# Patient Record
Sex: Male | Born: 1965 | Race: White | Hispanic: No | Marital: Married | Smoking: Former smoker
Health system: Southern US, Community
[De-identification: ages and names within clinical notes are randomized; demographics above are authoritative.]

## PROBLEM LIST (undated history)

## (undated) DIAGNOSIS — H269 Unspecified cataract: Secondary | ICD-10-CM

## (undated) DIAGNOSIS — R519 Headache, unspecified: Secondary | ICD-10-CM

## (undated) DIAGNOSIS — R51 Headache: Secondary | ICD-10-CM

## (undated) DIAGNOSIS — M1990 Unspecified osteoarthritis, unspecified site: Secondary | ICD-10-CM

## (undated) DIAGNOSIS — T7840XA Allergy, unspecified, initial encounter: Secondary | ICD-10-CM

## (undated) HISTORY — DX: Allergy, unspecified, initial encounter: T78.40XA

## (undated) HISTORY — PX: ELBOW FRACTURE SURGERY: SHX616

## (undated) HISTORY — DX: Unspecified cataract: H26.9

## (undated) HISTORY — PX: HERNIA REPAIR: SHX51

---

## 2012-09-22 HISTORY — PX: COLONOSCOPY: SHX174

## 2012-11-30 ENCOUNTER — Ambulatory Visit (INDEPENDENT_AMBULATORY_CARE_PROVIDER_SITE_OTHER): Payer: 59 | Admitting: Emergency Medicine

## 2012-11-30 VITALS — BP 110/62 | HR 69 | Temp 98.0°F | Resp 16 | Ht 70.0 in | Wt 154.2 lb

## 2012-11-30 DIAGNOSIS — B86 Scabies: Secondary | ICD-10-CM

## 2012-11-30 MED ORDER — PERMETHRIN 5 % EX CREA: TOPICAL_CREAM | Freq: Once | CUTANEOUS | Status: DC

## 2012-11-30 NOTE — Progress Notes (Signed)
[facility] [address] [place] [address] [phone]  Date:  11/30/2012   Name:  Dennis Morris   DOB:  1966/07/22   MRN:  [id number]  PCP:  No primary provider on file.    Chief Complaint: Rash   History of Present Illness:  Dennis Morris is a 47 y.o. very pleasant male patient who presents with the following:  Developed a rash three weeks ago.  Told that it was allergic reaction and treated as such.  He found that one of his coworkers was treated for scabies.  He works in Science writer.  Denies other complaint or health concern today.   There is no problem list on file for this patient.   Past Medical History  Diagnosis Date  . Allergy     No past surgical history on file.  History  Substance Use Topics  . Smoking status: Former Games developer  . Smokeless tobacco: Not on file  . Alcohol Use: Not on file    Family History  Problem Relation Age of Onset  . Arthritis Mother   . Kidney disease Father   . Arthritis Maternal Grandmother   . Heart disease Maternal Grandfather   . Cancer Paternal Grandmother     No Known Allergies  Medication list has been reviewed and updated.  No current outpatient prescriptions on file prior to visit.   No current facility-administered medications on file prior to visit.    Review of Systems:  As per HPI, otherwise negative.    Physical Examination: Filed Vitals:   11/30/12 1402  BP: 110/62  Pulse: 69  Temp: 98 F (36.7 C)  Resp: 16   Filed Vitals:   11/30/12 1402  Height: 5\' 10"  (1.778 m)  Weight: 154 lb 3.2 oz (69.945 kg)   Body mass index is 22.13 kg/(m^2). Ideal Body Weight: Weight in (lb) to have BMI = 25: 173.9   GEN: WDWN, NAD, Non-toxic, Alert & Oriented x 3 HEENT: Atraumatic, Normocephalic.  Ears and Nose: No external deformity. EXTR: No clubbing/cyanosis/edema NEURO: Normal gait.  PSYCH: Normally interactive. Conversant. Not depressed or anxious appearing.  Calm demeanor.   SKIN: generalized eruption of rash characteristic of scabies  Assessment and Plan: Scabies permethrin Follow up as needed  Carmelina Dane, MD

## 2012-11-30 NOTE — Patient Instructions (Signed)
Scabies Scabies are small bugs (mites) that burrow under the skin and cause red bumps and severe itching. These bugs can only be seen with a microscope. Scabies are highly contagious. They can spread easily from person to person by direct contact. They are also spread through sharing clothing or linens that have the scabies mites living in them. It is not unusual for an entire family to become infected through shared towels, clothing, or bedding.  HOME CARE INSTRUCTIONS   Your caregiver may prescribe a cream or lotion to kill the mites. If cream is prescribed, massage the cream into the entire body from the neck to the bottom of both feet. Also massage the cream into the scalp and face if your child is less than 1 year old. Avoid the eyes and mouth. Do not wash your hands after application.  Leave the cream on for 8 to 12 hours. Your child should bathe or shower after the 8 to 12 hour application period. Sometimes it is helpful to apply the cream to your child right before bedtime.  One treatment is usually effective and will eliminate approximately 95% of infestations. For severe cases, your caregiver may decide to repeat the treatment in 1 week. Everyone in your household should be treated with one application of the cream.  New rashes or burrows should not appear within 24 to 48 hours after successful treatment. However, the itching and rash may last for 2 to 4 weeks after successful treatment. Your caregiver may prescribe a medicine to help with the itching or to help the rash go away more quickly.  Scabies can live on clothing or linens for up to 3 days. All of your child's recently used clothing, towels, stuffed toys, and bed linens should be washed in hot water and then dried in a dryer for at least 20 minutes on high heat. Items that cannot be washed should be enclosed in a plastic bag for at least 3 days.  To help relieve itching, bathe your child in a cool bath or apply cool washcloths to the  affected areas.  Your child may return to school after treatment with the prescribed cream. SEEK MEDICAL CARE IF:   The itching persists longer than 4 weeks after treatment.  The rash spreads or becomes infected. Signs of infection include red blisters or yellow-tan crust. Document Released: 09/08/2005 Document Revised: 12/01/2011 Document Reviewed: 01/17/2009 ExitCare Patient Information 2013 [employer].  

## 2012-12-15 NOTE — Progress Notes (Signed)
Reviewed and agree.

## 2016-06-02 ENCOUNTER — Ambulatory Visit (INDEPENDENT_AMBULATORY_CARE_PROVIDER_SITE_OTHER): Payer: 59 | Admitting: Physician Assistant

## 2016-06-02 VITALS — BP 118/80 | HR 73 | Temp 97.9°F | Resp 17 | Ht 70.0 in | Wt 155.0 lb

## 2016-06-02 DIAGNOSIS — R1900 Intra-abdominal and pelvic swelling, mass and lump, unspecified site: Secondary | ICD-10-CM

## 2016-06-02 DIAGNOSIS — R1909 Other intra-abdominal and pelvic swelling, mass and lump: Secondary | ICD-10-CM

## 2016-06-02 DIAGNOSIS — R1031 Right lower quadrant pain: Secondary | ICD-10-CM

## 2016-06-02 LAB — POC MICROSCOPIC URINALYSIS (UMFC): MUCUS RE: ABSENT

## 2016-06-02 LAB — POCT URINALYSIS DIP (MANUAL ENTRY)
BILIRUBIN UA: NEGATIVE
Bilirubin, UA: NEGATIVE
Glucose, UA: NEGATIVE
Leukocytes, UA: NEGATIVE
Nitrite, UA: NEGATIVE
PH UA: 7
PROTEIN UA: NEGATIVE
SPEC GRAV UA: 1.015
UROBILINOGEN UA: 0.2

## 2016-06-02 NOTE — Progress Notes (Signed)
[facility] [address] [place] [address] [phone]  By signing my name below, I, Moises Blood, attest that this documentation has been prepared under the direction and in the presence of Stephanie English, PA-C. Electronically Signed: Moises Blood, Scribe. 06/02/2016 , 4:43 PM .  Patient was seen in Room 13 .  Date:  06/02/2016   Name:  Dennis Morris   DOB:  March 11, 1966   MRN:  [id number]  PCP:  No PCP Per Patient    History of Present Illness: Chief Complaint  Patient presents with   Hernia    Noticed last month after working out    Dennis Morris is a 50 y.o. male patient who presents to [facility] complaining of an abdominal bulge noticed less than a month ago after working out. Patient states he was doing the "[employer] workout", and was doing a deadlift. He felt a bulge in his RLQ abdomen. He mentions occasional soreness when he lifts. He denies history of any hernias. He denies any fever. He rates his pain at a 1~2 out of 10 today. He denies pain radiating into his groin.   There are no active problems to display for this patient.   Past Medical History:  Diagnosis Date   Allergy    Cataract     No past surgical history on file.  Social History  Substance Use Topics   Smoking status: Former Smoker   Smokeless tobacco: Not on file   Alcohol use Not on file    Family History  Problem Relation Age of Onset   Arthritis Mother    Kidney disease Father    Arthritis Maternal Grandmother    Heart disease Maternal Grandfather    Cancer Paternal Grandmother     No Known Allergies  Medication list has been reviewed and updated.  Current Outpatient Prescriptions on File Prior to Visit  Medication Sig Dispense Refill   aspirin-acetaminophen-caffeine (EXCEDRIN MIGRAINE) 250-250-65 MG per tablet Take 1 tablet by mouth every 6 (six) hours as needed for pain.     No current facility-administered medications on file prior to visit.      Review of Systems  Constitutional: Negative for chills, fever and malaise/fatigue.  Gastrointestinal: Negative for abdominal pain, constipation, diarrhea and nausea.  Musculoskeletal: Negative for myalgias.     Physical Examination: BP 118/80 (BP Location: Right Arm, Patient Position: Sitting, Cuff Size: Normal)    Pulse 73    Temp 97.9 F (36.6 C) (Oral)    Resp 17    Ht 5\' 10"  (1.778 m)    Wt 155 lb (70.3 kg)    SpO2 97%    BMI 22.24 kg/m  Ideal Body Weight: @FLOWAMB [id number]  Physical Exam  Constitutional: He is oriented to person, place, and time. He appears well-developed and well-nourished. No distress.  HENT:  Head: Normocephalic and atraumatic.  Eyes: Conjunctivae and EOM are normal. Pupils are equal, round, and reactive to light.  Cardiovascular: Normal rate.   Pulmonary/Chest: Effort normal. No respiratory distress.  Abdominal: Soft. Bowel sounds are normal. He exhibits no distension. There is no tenderness. A hernia is present. Hernia confirmed negative in the right inguinal area and confirmed negative in the left inguinal area.  Neurological: He is alert and oriented to person, place, and time.  Skin: Skin is warm and dry. He is not diaphoretic.  Psychiatric: He has a normal mood and affect. His behavior is normal.   Results for orders placed or performed in visit on  06/02/16  POCT urinalysis dipstick  Result Value Ref Range   Color, UA yellow yellow   Clarity, UA clear clear   Glucose, UA negative negative   Bilirubin, UA negative negative   Ketones, POC UA negative negative   Spec Grav, UA 1.015    Blood, UA trace-intact (A) negative   pH, UA 7.0    Protein Ur, POC negative negative   Urobilinogen, UA 0.2    Nitrite, UA Negative Negative   Leukocytes, UA Negative Negative     Assessment and Plan: Randie Ewin is a 50 y.o. male who is here today for groin swelling. --will order an [place] sound for pelvis at this time for possible hernia.    Groin  swelling - Plan: POCT urinalysis dipstick, US Pelvis Complete  Groin pain, right - Plan: US Pelvis Complete, POCT Microscopic Urinalysis ([facility])  Ivar Drape, PA-C [facility] 9/15/20179:27 AM

## 2016-06-02 NOTE — Patient Instructions (Addendum)
I am scheduling an ultrasound of your pelvis.  Please await contact.  This is likely a hernia, but we will see if this could be something else.  Hernia, Adult A hernia is the bulging of an organ or tissue through a weak spot in the muscles of the abdomen (abdominal wall). Hernias develop most often near the navel or groin. There are many kinds of hernias. Common kinds include:  Femoral hernia. This kind of hernia develops under the groin in the upper thigh area.  Inguinal hernia. This kind of hernia develops in the groin or scrotum.  Umbilical hernia. This kind of hernia develops near the navel.  Hiatal hernia. This kind of hernia causes part of the stomach to be pushed up into the chest.  Incisional hernia. This kind of hernia bulges through a scar from an abdominal surgery. CAUSES This condition may be caused by:  Heavy lifting.  Coughing over a long period of time.  Straining to have a bowel movement.  An incision made during an abdominal surgery.  A birth defect (congenital defect).  Excess weight or obesity.  Smoking.  Poor nutrition.  Cystic fibrosis.  Excess fluid in the abdomen.  Undescended testicles. SYMPTOMS Symptoms of a hernia include:  A lump on the abdomen. This is the first sign of a hernia. The lump may become more obvious with standing, straining, or coughing. It may get bigger over time if it is not treated or if the condition causing it is not treated.  Pain. A hernia is usually painless, but it may become painful over time if treatment is delayed. The pain is usually dull and may get worse with standing or lifting heavy objects. Sometimes a hernia gets tightly squeezed in the weak spot (strangulated) or stuck there (incarcerated) and causes additional symptoms. These symptoms may include:  Vomiting.  Nausea.  Constipation.  Irritability. DIAGNOSIS A hernia may be diagnosed with:  A physical exam. During the exam your health care provider  may ask you to cough or to make a specific movement, because a hernia is usually more visible when you move.  Imaging tests. These can include:  X-rays.  Ultrasound.  CT scan. TREATMENT A hernia that is small and painless may not need to be treated. A hernia that is large or painful may be treated with surgery. Inguinal hernias may be treated with surgery to prevent incarceration or strangulation. Strangulated hernias are always treated with surgery, because lack of blood to the trapped organ or tissue can cause it to die. Surgery to treat a hernia involves pushing the bulge back into place and repairing the weak part of the abdomen. HOME CARE INSTRUCTIONS  Avoid straining.  Do not lift anything heavier than 10 lb (4.5 kg).  Lift with your leg muscles, not your back muscles. This helps avoid strain.  When coughing, try to cough gently.  Prevent constipation. Constipation leads to straining with bowel movements, which can make a hernia worse or cause a hernia repair to break down. You can prevent constipation by:  Eating a high-fiber diet that includes plenty of fruits and vegetables.  Drinking enough fluids to keep your urine clear or pale yellow. Aim to drink 6-8 glasses of water per day.  Using a stool softener as directed by your health care provider.  Lose weight, if you are overweight.  Do not use any tobacco products, including cigarettes, chewing tobacco, or electronic cigarettes. If you need help quitting, ask your health care provider.  Keep all  follow-up visits as directed by your health care provider. This is important. Your health care provider may need to monitor your condition. SEEK MEDICAL CARE IF:  You have swelling, redness, and pain in the affected area.  Your bowel habits change. SEEK IMMEDIATE MEDICAL CARE IF:  You have a fever.  You have abdominal pain that is getting worse.  You feel nauseous or you vomit.  You cannot push the hernia back in place  by gently pressing on it while you are lying down.  The hernia:  Changes in shape or size.  Is stuck outside the abdomen.  Becomes discolored.  Feels hard or tender.   This information is not intended to replace advice given to you by your health care provider. Make sure you discuss any questions you have with your health care provider.   Document Released: 09/08/2005 Document Revised: 09/29/2014 Document Reviewed: 07/19/2014 Elsevier Interactive Patient Education 2016 [employer].    IF you received an x-ray today, you will receive an invoice from [facility]. Please contact [facility] at [phone] with questions or concerns regarding your invoice.   IF you received labwork today, you will receive an invoice from [employer]. Please contact [facility] at [phone] with questions or concerns regarding your invoice.   Our billing staff will not be able to assist you with questions regarding bills from these companies.  You will be contacted with the lab results as soon as they are available. The fastest way to get your results is to activate your My Chart account. Instructions are located on the last page of this paperwork. If you have not heard from [place] regarding the results in 2 weeks, please contact this office.

## 2016-06-09 ENCOUNTER — Telehealth: Payer: Self-pay

## 2016-06-09 DIAGNOSIS — R1031 Right lower quadrant pain: Secondary | ICD-10-CM

## 2016-06-09 DIAGNOSIS — R1909 Other intra-abdominal and pelvic swelling, mass and lump: Secondary | ICD-10-CM

## 2016-06-09 NOTE — Telephone Encounter (Addendum)
[facility] needs the pelvis ultrasound order from 06/02/16 changed.  It is currently listed as an ultrasound pelvis complete, but it needs to be changed to an ultrasound pelvis limited [id number]).  Thank you.  The patient has an appointment scheduled for Monday but the order will still need to be changed in order to keep the appointment.  CB#: [id number]

## 2016-06-13 NOTE — Telephone Encounter (Signed)
Thank you.  I signed off on this order that you (and [place]) requested.

## 2016-06-13 NOTE — Telephone Encounter (Signed)
Dennis Morris, I pended the US Pelvis Limited that [facility] requested, but I did not sign ir. Wanted to make sure this is the test you want done.

## 2016-06-16 ENCOUNTER — Ambulatory Visit
Admission: RE | Admit: 2016-06-16 | Discharge: 2016-06-16 | Disposition: A | Discharge status: Home / Self Care | Payer: 59 | Source: Ambulatory Visit | Attending: Physician Assistant | Admitting: Physician Assistant

## 2016-06-17 ENCOUNTER — Ambulatory Visit: Payer: 59

## 2016-06-18 ENCOUNTER — Telehealth: Payer: Self-pay

## 2016-06-18 NOTE — Telephone Encounter (Signed)
Dennis Morris   Patient would like to talk with you.  He is at work and cannot answer the phone.

## 2016-06-23 NOTE — Telephone Encounter (Signed)
Patient states he has already spoken to [place] . Best time to call him is after 3pm

## 2016-06-24 ENCOUNTER — Other Ambulatory Visit: Payer: Self-pay | Admitting: Physician Assistant

## 2016-06-24 DIAGNOSIS — K439 Ventral hernia without obstruction or gangrene: Secondary | ICD-10-CM

## 2016-07-07 ENCOUNTER — Ambulatory Visit: Payer: Self-pay | Admitting: Surgery

## 2016-07-07 NOTE — H&P (Signed)
Dennis Morris 07/07/2016 3:52 PM Location: [facility] Patient #: [id number] DOB: [birth year] Married / Language: English / Race: White Male  History of Present Illness Dennis Hector MD; 07/07/2016 4:16 PM) The patient is a 50 year old male who presents with an inguinal hernia. Note for "Inguinal hernia": Patient sent for surgical consultation [facility] Concern for right groin pain and probable inguinal hernia.  Pleasant active male. Works with an occupational therapy with lifting and other activity. Likes to exercise. Moderate kickboxing style exercising. Noticed some groin discomfort and swelling. Right side especially. Discuss with primary care physician had urgent center. Exam and ultrasound suspicious for retinal hernia. Patient has noticed a little bit of bulging on the left side recently. Surgical consultation requested.  He does not smoke anymore. He has never had abdominal or hernia surgery. No problems with urination or defecation. Usually moves his bowels about twice a day.   Other Problems Marjean Donna, CMA; 07/07/2016 3:53 PM) Other disease, cancer, significant illness  Past Surgical History Marjean Donna, [place]; 07/07/2016 3:53 PM) Colon Polyp Removal - Colonoscopy  Diagnostic Studies History Marjean Donna, CMA; 07/07/2016 3:53 PM) Colonoscopy 1-5 years ago  Allergies Davy Pique Bynum, CMA; 07/07/2016 3:53 PM) No Known Drug Allergies 07/07/2016  Medication History (Sonya Bynum, CMA; 07/07/2016 3:54 PM) ZyrTEC Allergy (10MG  Capsule, Oral) Active. Excedrin Migraine (250-250-65MG  Tablet, Oral as needed) Active. Medications Reconciled  Social History Marjean Donna, CMA; 07/07/2016 3:53 PM) Alcohol use Occasional alcohol use. Caffeine use Carbonated beverages. No drug use Tobacco use Former smoker.  Family History Marjean Donna, [place]; 07/07/2016 3:53 PM) Arthritis Father,  Mother. Cancer Sister. Colon Cancer Father, Sister. Colon Polyps Brother, Father, Sister. Depression Mother. Diabetes Mellitus Father. Kidney Disease Father.     Review of Systems Davy Pique Bynum CMA; 07/07/2016 3:53 PM) General Not Present- Appetite Loss, Chills, Fatigue, Fever, Night Sweats, Weight Gain and Weight Loss. Skin Not Present- Change in Wart/Mole, Dryness, Hives, Jaundice, New Lesions, Non-Healing Wounds, Rash and Ulcer. HEENT Present- Seasonal Allergies. Not Present- Earache, Hearing Loss, Hoarseness, Nose Bleed, Oral Ulcers, Ringing in the Ears, Sinus Pain, Sore Throat, Visual Disturbances, Wears glasses/contact lenses and Yellow Eyes. Respiratory Not Present- Bloody sputum, Chronic Cough, Difficulty Breathing, Snoring and Wheezing. Breast Not Present- Breast Mass, Breast Pain, Nipple Discharge and Skin Changes. Cardiovascular Not Present- Chest Pain, Difficulty Breathing Lying Down, Leg Cramps, Palpitations, Rapid Heart Rate, Shortness of Breath and Swelling of Extremities. Gastrointestinal Present- Abdominal Pain. Not Present- Bloating, Bloody Stool, Change in Bowel Habits, Chronic diarrhea, Constipation, Difficulty Swallowing, Excessive gas, Gets full quickly at meals, Hemorrhoids, Indigestion, Nausea, Rectal Pain and Vomiting. Male Genitourinary Not Present- Blood in Urine, Change in Urinary Stream, Frequency, Impotence, Nocturia, Painful Urination, Urgency and Urine Leakage. Musculoskeletal Not Present- Back Pain, Joint Pain, Joint Stiffness, Muscle Pain, Muscle Weakness and Swelling of Extremities. Neurological Not Present- Decreased Memory, Fainting, Headaches, Numbness, Seizures, Tingling, Tremor, Trouble walking and Weakness. Psychiatric Not Present- Anxiety, Bipolar, Change in Sleep Pattern, Depression, Fearful and Frequent crying. Endocrine Not Present- Cold Intolerance, Excessive Hunger, Hair Changes, Heat Intolerance, Hot flashes and New Diabetes. Hematology Not  Present- Blood Thinners, Easy Bruising, Excessive bleeding, Gland problems, HIV and Persistent Infections.  Vitals (Sonya Bynum CMA; 07/07/2016 3:53 PM) 07/07/2016 3:53 PM Weight: 154.4 lb Height: 70in Body Surface Area: 1.87 m Body Mass Index: 22.15 kg/m  Temp.: 98.3F(Temporal)  Pulse: 74 (Regular)  BP: 126/80 (Sitting, Left Arm, Standard)      Physical Exam (  Dennis Hector MD; 07/07/2016 4:13 PM)  General Mental Status-Alert. General Appearance-Not in acute distress, Not Sickly. Orientation-Oriented X3. Hydration-Well hydrated. Voice-Normal.  Integumentary Global Assessment Upon inspection and palpation of skin surfaces of the - Axillae: non-tender, no inflammation or ulceration, no drainage. and Distribution of scalp and body hair is normal. General Characteristics Temperature - normal warmth is noted.  Head and Neck Head-normocephalic, atraumatic with no lesions or palpable masses. Face Global Assessment - atraumatic, no absence of expression. Neck Global Assessment - no abnormal movements, no bruit auscultated on the right, no bruit auscultated on the left, no decreased range of motion, non-tender. Trachea-midline. Thyroid Gland Characteristics - non-tender.  Eye Eyeball - Left-Extraocular movements intact, No Nystagmus. Eyeball - Right-Extraocular movements intact, No Nystagmus. Cornea - Left-No Hazy. Cornea - Right-No Hazy. Sclera/Conjunctiva - Left-No scleral icterus, No Discharge. Sclera/Conjunctiva - Right-No scleral icterus, No Discharge. Pupil - Left-Direct reaction to light normal. Pupil - Right-Direct reaction to light normal.  ENMT Ears Pinna - Left - no drainage observed, no generalized tenderness observed. Right - no drainage observed, no generalized tenderness observed. Nose and Sinuses External Inspection of the Nose - no destructive lesion observed. Inspection of the nares - Left - quiet  respiration. Right - quiet respiration. Mouth and Throat Lips - Upper Lip - no fissures observed, no pallor noted. Lower Lip - no fissures observed, no pallor noted. Nasopharynx - no discharge present. Oral Cavity/Oropharynx - Tongue - no dryness observed. Oral Mucosa - no cyanosis observed. Hypopharynx - no evidence of airway distress observed.  Chest and Lung Exam Inspection Movements - Normal and Symmetrical. Accessory muscles - No use of accessory muscles in breathing. Palpation Palpation of the chest reveals - Non-tender. Auscultation Breath sounds - Normal and Clear.  Cardiovascular Auscultation Rhythm - Regular. Murmurs & Other Heart Sounds - Auscultation of the heart reveals - No Murmurs and No Systolic Clicks.  Abdomen Inspection Inspection of the abdomen reveals - No Visible peristalsis and No Abnormal pulsations. Umbilicus - No Bleeding, No Urine drainage. Palpation/Percussion Palpation and Percussion of the abdomen reveal - Soft, Non Tender, No Rebound tenderness, No Rigidity (guarding) and No Cutaneous hyperesthesia. Note: No inguinal hernias. Normal external genitalia. Epididymi, testes, and spermatic cords normal without any masses.  Male Genitourinary Sexual Maturity Tanner 5 - Adult hair pattern and Adult penile size and shape. Note: Right greater than left bilateral inguinal hernias. Sensitive but reducible. Focused mainly in the groin. Normal external genitalia. Epididymi, testes, and spermatic cords normal without any masses.  Peripheral Vascular Upper Extremity Inspection - Left - No Cyanotic nailbeds, Not Ischemic. Right - No Cyanotic nailbeds, Not Ischemic.  Neurologic Neurologic evaluation reveals -normal attention span and ability to concentrate, able to name objects and repeat phrases. Appropriate fund of knowledge , normal sensation and normal coordination. Mental Status Affect - not angry, not paranoid. Cranial Nerves-Normal  Bilaterally. Gait-Normal.  Neuropsychiatric Mental status exam performed with findings of-able to articulate well with normal speech/language, rate, volume and coherence, thought content normal with ability to perform basic computations and apply abstract reasoning and no evidence of hallucinations, delusions, obsessions or homicidal/suicidal ideation.  Musculoskeletal Global Assessment Spine, Ribs and Pelvis - no instability, subluxation or laxity. Right Upper Extremity - no instability, subluxation or laxity.  Lymphatic Head & Neck  General Head & Neck Lymphatics: Bilateral - Description - No Localized lymphadenopathy. Axillary  General Axillary Region: Bilateral - Description - No Localized lymphadenopathy. Femoral & Inguinal  Generalized Femoral & Inguinal Lymphatics: Left -  Description - No Localized lymphadenopathy. Right - Description - No Localized lymphadenopathy.    Assessment & Plan Dennis Hector MD; 07/07/2016 4:17 PM)  BILATERAL INGUINAL HERNIA WITHOUT OBSTRUCTION OR GANGRENE, RECURRENCE NOT SPECIFIED (K40.20) Impression: Right greater than left bilateral inguinal hernias. Sensitive.  I think he would benefit from surgical repair. He wishes to proceed as soon as possible so he can get back to work and back to his intense exercise. 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 I did caution that he probably be 2 weeks before light duty in 6 weeks before unrestricted activity. He will check with work to see if there is any a formal FMLA like paperwork etc. needed.  PREOP - ING HERNIA - ENCOUNTER FOR PREOPERATIVE EXAMINATION FOR GENERAL SURGICAL PROCEDURE (Z01.818)  Current Plans You are being scheduled for surgery - Our schedulers will call you.  You should hear from our office's scheduling department within 5 working days about the location, date, and time of surgery. We try to make accommodations for patient's  preferences in scheduling surgery, but sometimes the OR schedule or the surgeon's schedule prevents [place] from making those accommodations.  If you have not heard from our office [phone]) in 5 working days, call the office and ask for your surgeon's nurse.  If you have other questions about your diagnosis, plan, or surgery, call the office and ask for your surgeon's nurse.  Written instructions provided The anatomy & physiology of the abdominal wall and pelvic floor was discussed. The pathophysiology of hernias in the inguinal and pelvic region was discussed. Natural history risks such as progressive enlargement, pain, incarceration, and strangulation was discussed. Contributors to complications such as smoking, obesity, diabetes, prior surgery, etc were discussed.  I feel the risks of no intervention will lead to serious problems that outweigh the operative risks; therefore, I recommended surgery to reduce and repair the hernia. I explained laparoscopic techniques with possible need for an open approach. I noted usual use of mesh to patch and/or buttress hernia repair  Risks such as bleeding, infection, abscess, need for further treatment, heart attack, death, and other risks were discussed. I noted a good likelihood this will help address the problem. Goals of post-operative recovery were discussed as well. Possibility that this will not correct all symptoms was explained. I stressed the importance of low-impact activity, aggressive pain control, avoiding constipation, & not pushing through pain to minimize risk of post-operative chronic pain or injury. Possibility of reherniation was discussed. We will work to minimize complications.  An educational handout further explaining the pathology & treatment options was given as well. Questions were answered. The patient expresses understanding & wishes to proceed with surgery.  Pt Education - Pamphlet Given - Laparoscopic Hernia Repair: discussed with  patient and provided information. Pt Education - [facility] Pain Control (Maryelizabeth Eberle) Pt Education - [facility] Hernia Post-Op HCI (Hosanna Betley): discussed with patient and provided information.  Dennis Morris, M.D., F.A.C.S. Gastrointestinal and Minimally Invasive Surgery [facility] [address] [place], [place] [address] [phone] Main / Paging

## 2016-08-18 ENCOUNTER — Encounter (HOSPITAL_COMMUNITY): Payer: Self-pay

## 2016-08-18 ENCOUNTER — Encounter (INDEPENDENT_AMBULATORY_CARE_PROVIDER_SITE_OTHER): Payer: Self-pay

## 2016-08-18 ENCOUNTER — Encounter (HOSPITAL_COMMUNITY)
Admission: RE | Admit: 2016-08-18 | Discharge: 2016-08-18 | Disposition: A | Discharge status: Home / Self Care | Payer: 59 | Source: Ambulatory Visit | Attending: Surgery | Admitting: Surgery

## 2016-08-18 DIAGNOSIS — Z87891 Personal history of nicotine dependence: Secondary | ICD-10-CM | POA: Diagnosis not present

## 2016-08-18 DIAGNOSIS — Z01818 Encounter for other preprocedural examination: Secondary | ICD-10-CM | POA: Insufficient documentation

## 2016-08-18 DIAGNOSIS — K4090 Unilateral inguinal hernia, without obstruction or gangrene, not specified as recurrent: Secondary | ICD-10-CM

## 2016-08-18 DIAGNOSIS — K4020 Bilateral inguinal hernia, without obstruction or gangrene, not specified as recurrent: Secondary | ICD-10-CM | POA: Diagnosis present

## 2016-08-18 HISTORY — DX: Unspecified osteoarthritis, unspecified site: M19.90

## 2016-08-18 HISTORY — DX: Headache, unspecified: R51.9

## 2016-08-18 HISTORY — DX: Headache: R51

## 2016-08-18 NOTE — Patient Instructions (Addendum)
Dennis Morris  08/18/2016   Your procedure is scheduled on: 08-21-16  Report to [facility]  Entrance take [facility]  elevators to 3rd floor to  [place] at    0830 AM.  Call this number if you have problems the morning of surgery [phone]   Remember: ONLY 1 PERSON MAY GO WITH YOU TO SHORT STAY TO GET  READY MORNING OF [place].  Do not eat food or drink liquids :After Midnight.     Take these medicines the morning of surgery with A SIP OF WATER: NONE. DO NOT TAKE ANY DIABETIC MEDICATIONS DAY OF YOUR SURGERY                               You may not have any metal on your body including hair pins and              piercings  Do not wear jewelry, make-up, lotions, powders or perfumes, deodorant             Do not wear nail polish.  Do not shave  48 hours prior to surgery.              Men may shave face and neck.   Do not bring valuables to the hospital. [place].  Contacts, dentures or bridgework may not be worn into surgery.  Leave suitcase in the car. After surgery it may be brought to your room.     Patients discharged the day of surgery will not be allowed to drive home.  Name and phone number of your driver: Nira Conn -spouse [phone] cell  Special Instructions: N/A              Please read over the following fact sheets you were given: _____________________________________________________________________             [facility] - Preparing for Surgery Before surgery, you can play an important role.  Because skin is not sterile, your skin needs to be as free of germs as possible.  You can reduce the number of germs on your skin by washing with CHG (chlorahexidine gluconate) soap before surgery.  CHG is an antiseptic cleaner which kills germs and bonds with the skin to continue killing germs even after washing. Please DO NOT use if you have an allergy to CHG or antibacterial soaps.   If your skin becomes reddened/irritated stop using the CHG and inform your nurse when you arrive at Short Stay. Do not shave (including legs and underarms) for at least 48 hours prior to the first CHG shower.  You may shave your face/neck. Please follow these instructions carefully:  1.  Shower with CHG Soap the night before surgery and the  morning of Surgery.  2.  If you choose to wash your hair, wash your hair first as usual with your  normal  shampoo.  3.  After you shampoo, rinse your hair and body thoroughly to remove the  shampoo.                           4.  Use CHG as you would any other liquid soap.  You can apply chg directly  to the skin and wash                       Gently with a scrungie or clean washcloth.  5.  Apply the CHG Soap to your body ONLY FROM THE NECK DOWN.   Do not use on face/ open                           Wound or open sores. Avoid contact with eyes, ears mouth and genitals (private parts).                       Wash face,  Genitals (private parts) with your normal soap.             6.  Wash thoroughly, paying special attention to the area where your surgery  will be performed.  7.  Thoroughly rinse your body with warm water from the neck down.  8.  DO NOT shower/wash with your normal soap after using and rinsing off  the CHG Soap.                9.  Pat yourself dry with a clean towel.            10.  Wear clean pajamas.            11.  Place clean sheets on your bed the night of your first shower and do not  sleep with pets. Day of Surgery : Do not apply any lotions/deodorants the morning of surgery.  Please wear clean clothes to the hospital/surgery center.  FAILURE TO FOLLOW THESE INSTRUCTIONS MAY RESULT IN THE CANCELLATION OF YOUR SURGERY PATIENT SIGNATURE_________________________________  NURSE SIGNATURE__________________________________  ________________________________________________________________________

## 2016-08-21 ENCOUNTER — Ambulatory Visit (HOSPITAL_COMMUNITY): Payer: 59 | Admitting: Anesthesiology

## 2016-08-21 ENCOUNTER — Encounter (HOSPITAL_COMMUNITY): Payer: Self-pay | Admitting: *Deleted

## 2016-08-21 ENCOUNTER — Encounter (HOSPITAL_COMMUNITY)
Admission: RE | Disposition: A | Discharge status: Home / Self Care | Payer: Self-pay | Source: Ambulatory Visit | Attending: Surgery

## 2016-08-21 ENCOUNTER — Ambulatory Visit (HOSPITAL_COMMUNITY)
Admission: RE | Admit: 2016-08-21 | Discharge: 2016-08-21 | Disposition: A | Discharge status: Home / Self Care | Payer: 59 | Source: Ambulatory Visit | Attending: Surgery | Admitting: Surgery

## 2016-08-21 DIAGNOSIS — K4020 Bilateral inguinal hernia, without obstruction or gangrene, not specified as recurrent: Secondary | ICD-10-CM | POA: Diagnosis not present

## 2016-08-21 DIAGNOSIS — Z01818 Encounter for other preprocedural examination: Secondary | ICD-10-CM | POA: Insufficient documentation

## 2016-08-21 DIAGNOSIS — Z87891 Personal history of nicotine dependence: Secondary | ICD-10-CM | POA: Insufficient documentation

## 2016-08-21 HISTORY — PX: INGUINAL HERNIA REPAIR: SHX194

## 2016-08-21 HISTORY — PX: INSERTION OF MESH: SHX5868

## 2016-08-21 SURGERY — REPAIR, HERNIA, INGUINAL, BILATERAL, LAPAROSCOPIC
Anesthesia: General | Laterality: Bilateral

## 2016-08-21 MED ORDER — SUCCINYLCHOLINE CHLORIDE 20 MG/ML IJ SOLN
INTRAMUSCULAR | Status: DC | PRN
  Administered 2016-08-21: 100 mg via INTRAVENOUS

## 2016-08-21 MED ORDER — TRAMADOL HCL 50 MG PO TABS: 50.0000 mg | ORAL_TABLET | Freq: Four times a day (QID) | ORAL | 0 refills | Status: DC | PRN

## 2016-08-21 MED ORDER — LACTATED RINGERS IR SOLN
Status: DC | PRN
  Administered 2016-08-21: 3000 mL

## 2016-08-21 MED ORDER — CHLORHEXIDINE GLUCONATE CLOTH 2 % EX PADS: 6.0000 | MEDICATED_PAD | Freq: Once | CUTANEOUS | Status: DC

## 2016-08-21 MED ORDER — SUGAMMADEX SODIUM 200 MG/2ML IV SOLN
INTRAVENOUS | Status: AC
  Filled 2016-08-21: qty 2

## 2016-08-21 MED ORDER — FENTANYL CITRATE (PF) 100 MCG/2ML IJ SOLN
INTRAMUSCULAR | Status: DC | PRN
  Administered 2016-08-21: 50 ug via INTRAVENOUS
  Administered 2016-08-21: 100 ug via INTRAVENOUS

## 2016-08-21 MED ORDER — PROPOFOL 10 MG/ML IV BOLUS
INTRAVENOUS | Status: AC
  Filled 2016-08-21: qty 20

## 2016-08-21 MED ORDER — LACTATED RINGERS IV SOLN
INTRAVENOUS | Status: DC
  Administered 2016-08-21 (×3): via INTRAVENOUS

## 2016-08-21 MED ORDER — LIDOCAINE 2% (20 MG/ML) 5 ML SYRINGE
INTRAMUSCULAR | Status: AC
  Filled 2016-08-21: qty 5

## 2016-08-21 MED ORDER — ACETAMINOPHEN 500 MG PO TABS
1000.0000 mg | ORAL_TABLET | ORAL | Status: AC
  Administered 2016-08-21: 1000 mg via ORAL
  Filled 2016-08-21: qty 2

## 2016-08-21 MED ORDER — CEFAZOLIN SODIUM-DEXTROSE 2-4 GM/100ML-% IV SOLN
INTRAVENOUS | Status: AC
  Filled 2016-08-21: qty 100

## 2016-08-21 MED ORDER — SODIUM CHLORIDE 0.9 % IJ SOLN
INTRAMUSCULAR | Status: AC
  Filled 2016-08-21: qty 50

## 2016-08-21 MED ORDER — HYDROMORPHONE HCL 1 MG/ML IJ SOLN
INTRAMUSCULAR | Status: AC
  Filled 2016-08-21: qty 1

## 2016-08-21 MED ORDER — ROCURONIUM BROMIDE 50 MG/5ML IV SOSY
PREFILLED_SYRINGE | INTRAVENOUS | Status: AC
  Filled 2016-08-21: qty 5

## 2016-08-21 MED ORDER — CEFAZOLIN SODIUM-DEXTROSE 2-4 GM/100ML-% IV SOLN
2.0000 g | INTRAVENOUS | Status: AC
  Administered 2016-08-21: 2 g via INTRAVENOUS
  Filled 2016-08-21: qty 100

## 2016-08-21 MED ORDER — FENTANYL CITRATE (PF) 250 MCG/5ML IJ SOLN
INTRAMUSCULAR | Status: AC
  Filled 2016-08-21: qty 5

## 2016-08-21 MED ORDER — HYDROMORPHONE HCL 1 MG/ML IJ SOLN
0.2500 mg | INTRAMUSCULAR | Status: DC | PRN
  Administered 2016-08-21: 0.5 mg via INTRAVENOUS

## 2016-08-21 MED ORDER — LIDOCAINE HCL (CARDIAC) 20 MG/ML IV SOLN
INTRAVENOUS | Status: DC | PRN
  Administered 2016-08-21: 50 mg via INTRAVENOUS

## 2016-08-21 MED ORDER — GABAPENTIN 300 MG PO CAPS
300.0000 mg | ORAL_CAPSULE | ORAL | Status: AC
  Administered 2016-08-21: 300 mg via ORAL
  Filled 2016-08-21: qty 1

## 2016-08-21 MED ORDER — ONDANSETRON HCL 4 MG/2ML IJ SOLN
INTRAMUSCULAR | Status: AC
  Filled 2016-08-21: qty 2

## 2016-08-21 MED ORDER — NAPROXEN 500 MG PO TABS: 500.0000 mg | ORAL_TABLET | Freq: Two times a day (BID) | ORAL | 1 refills | Status: DC | PRN

## 2016-08-21 MED ORDER — BUPIVACAINE HCL (PF) 0.5 % IJ SOLN
INTRAMUSCULAR | Status: AC
  Filled 2016-08-21: qty 30

## 2016-08-21 MED ORDER — DEXAMETHASONE SODIUM PHOSPHATE 10 MG/ML IJ SOLN
INTRAMUSCULAR | Status: DC | PRN
  Administered 2016-08-21: 10 mg via INTRAVENOUS

## 2016-08-21 MED ORDER — DEXAMETHASONE SODIUM PHOSPHATE 10 MG/ML IJ SOLN
INTRAMUSCULAR | Status: AC
  Filled 2016-08-21: qty 1

## 2016-08-21 MED ORDER — PHENYLEPHRINE 40 MCG/ML (10ML) SYRINGE FOR IV PUSH (FOR BLOOD PRESSURE SUPPORT)
PREFILLED_SYRINGE | INTRAVENOUS | Status: DC | PRN
  Administered 2016-08-21: 40 ug via INTRAVENOUS

## 2016-08-21 MED ORDER — STERILE WATER FOR IRRIGATION IR SOLN
Status: DC | PRN
  Administered 2016-08-21: 1000 mL

## 2016-08-21 MED ORDER — BUPIVACAINE HCL (PF) 0.5 % IJ SOLN
INTRAMUSCULAR | Status: DC | PRN
  Administered 2016-08-21: 30 mL

## 2016-08-21 MED ORDER — MIDAZOLAM HCL 5 MG/5ML IJ SOLN
INTRAMUSCULAR | Status: DC | PRN
  Administered 2016-08-21: 2 mg via INTRAVENOUS

## 2016-08-21 MED ORDER — PROPOFOL 10 MG/ML IV BOLUS
INTRAVENOUS | Status: DC | PRN
  Administered 2016-08-21: 200 mg via INTRAVENOUS

## 2016-08-21 MED ORDER — SUGAMMADEX SODIUM 200 MG/2ML IV SOLN
INTRAVENOUS | Status: DC | PRN
  Administered 2016-08-21: 200 mg via INTRAVENOUS

## 2016-08-21 MED ORDER — TRAMADOL HCL 50 MG PO TABS
50.0000 mg | ORAL_TABLET | Freq: Once | ORAL | Status: AC
  Administered 2016-08-21: 50 mg via ORAL
  Filled 2016-08-21: qty 1

## 2016-08-21 MED ORDER — ONDANSETRON HCL 4 MG/2ML IJ SOLN
INTRAMUSCULAR | Status: DC | PRN
  Administered 2016-08-21: 4 mg via INTRAVENOUS

## 2016-08-21 MED ORDER — CELECOXIB 200 MG PO CAPS
400.0000 mg | ORAL_CAPSULE | ORAL | Status: AC
  Administered 2016-08-21: 400 mg via ORAL
  Filled 2016-08-21: qty 2

## 2016-08-21 MED ORDER — 0.9 % SODIUM CHLORIDE (POUR BTL) OPTIME
TOPICAL | Status: DC | PRN
  Administered 2016-08-21: 1000 mL

## 2016-08-21 MED ORDER — ROCURONIUM BROMIDE 100 MG/10ML IV SOLN
INTRAVENOUS | Status: DC | PRN
  Administered 2016-08-21: 10 mg via INTRAVENOUS
  Administered 2016-08-21: 30 mg via INTRAVENOUS
  Administered 2016-08-21: 10 mg via INTRAVENOUS

## 2016-08-21 MED ORDER — MIDAZOLAM HCL 2 MG/2ML IJ SOLN
INTRAMUSCULAR | Status: AC
  Filled 2016-08-21: qty 2

## 2016-08-21 SURGICAL SUPPLY — 32 items
CABLE HIGH FREQUENCY MONO STRZ (ELECTRODE) ×3 IMPLANT
CHLORAPREP W/TINT 26ML (MISCELLANEOUS) ×3 IMPLANT
COVER SURGICAL LIGHT HANDLE (MISCELLANEOUS) ×3 IMPLANT
DECANTER SPIKE VIAL GLASS SM (MISCELLANEOUS) ×3 IMPLANT
DEVICE SECURE STRAP 25 ABSORB (INSTRUMENTS) IMPLANT
DRAPE WARM FLUID 44X44 (DRAPE) ×3 IMPLANT
DRSG TEGADERM 2-3/8X2-3/4 SM (GAUZE/BANDAGES/DRESSINGS) ×6 IMPLANT
DRSG TEGADERM 4X4.75 (GAUZE/BANDAGES/DRESSINGS) ×3 IMPLANT
ELECT REM PT RETURN 9FT ADLT (ELECTROSURGICAL) ×3
ELECTRODE REM PT RTRN 9FT ADLT (ELECTROSURGICAL) ×1 IMPLANT
GAUZE SPONGE 2X2 8PLY STRL LF (GAUZE/BANDAGES/DRESSINGS) ×1 IMPLANT
GLOVE ECLIPSE 8.0 STRL XLNG CF (GLOVE) ×3 IMPLANT
GLOVE INDICATOR 8.0 STRL GRN (GLOVE) ×3 IMPLANT
GOWN STRL REUS W/TWL XL LVL3 (GOWN DISPOSABLE) ×6 IMPLANT
IRRIG SUCT STRYKERFLOW 2 WTIP (MISCELLANEOUS)
IRRIGATION SUCT STRKRFLW 2 WTP (MISCELLANEOUS) IMPLANT
KIT BASIN OR (CUSTOM PROCEDURE TRAY) ×3 IMPLANT
MESH ULTRAPRO 6X6 15CM15CM (Mesh General) ×6 IMPLANT
PAD POSITIONING PINK XL (MISCELLANEOUS) ×3 IMPLANT
SCISSORS LAP 5X35 DISP (ENDOMECHANICALS) ×3 IMPLANT
SET IRRIG TUBING LAPAROSCOPIC (IRRIGATION / IRRIGATOR) ×3 IMPLANT
SLEEVE ADV FIXATION 5X100MM (TROCAR) ×3 IMPLANT
SPONGE GAUZE 2X2 STER 10/PKG (GAUZE/BANDAGES/DRESSINGS) ×2
SUT MNCRL AB 4-0 PS2 18 (SUTURE) ×3 IMPLANT
SUT VIC AB 2-0 SH 27 (SUTURE) ×2
SUT VIC AB 2-0 SH 27X BRD (SUTURE) ×1 IMPLANT
TACKER 5MM HERNIA 3.5CML NAB (ENDOMECHANICALS) IMPLANT
TOWEL OR 17X26 10 PK STRL BLUE (TOWEL DISPOSABLE) ×3 IMPLANT
TRAY LAPAROSCOPIC (CUSTOM PROCEDURE TRAY) ×3 IMPLANT
TROCAR ADV FIXATION 5X100MM (TROCAR) ×3 IMPLANT
TROCAR XCEL BLUNT TIP 100MML (ENDOMECHANICALS) ×3 IMPLANT
TUBING INSUF HEATED (TUBING) ×3 IMPLANT

## 2016-08-21 NOTE — Anesthesia Procedure Notes (Signed)
Procedure Name: Intubation Date/Time: 08/21/2016 10:25 AM Performed by: Glory Buff Pre-anesthesia Checklist: Patient identified, Emergency Drugs available, Suction available and Patient being monitored Patient Re-evaluated:Patient Re-evaluated prior to inductionOxygen Delivery Method: Circle system utilized Preoxygenation: Pre-oxygenation with 100% oxygen Intubation Type: IV induction Ventilation: Mask ventilation without difficulty Laryngoscope Size: Miller and 3 Grade View: Grade I Tube type: Oral Tube size: 7.5 mm Number of attempts: 1 Airway Equipment and Method: Stylet and Oral airway Placement Confirmation: ETT inserted through vocal cords under direct vision,  positive ETCO2 and breath sounds checked- equal and bilateral Secured at: 21 cm Tube secured with: Tape Dental Injury: Teeth and Oropharynx as per pre-operative assessment

## 2016-08-21 NOTE — Discharge Instructions (Signed)
HERNIA REPAIR: POST OP INSTRUCTIONS ° °###################################################################### ° °EAT °Gradually transition to a high fiber diet with a fiber supplement over the next few weeks after discharge.  Start with a pureed / full liquid diet (see below) ° °WALK °Walk an hour a day.  Control your pain to do that.   ° °CONTROL PAIN °Control pain so that you can walk, sleep, tolerate sneezing/coughing, go up/down stairs. ° °HAVE A BOWEL MOVEMENT DAILY °Keep your bowels regular to avoid problems.  OK to try a laxative to override constipation.  OK to use an antidairrheal to slow down diarrhea.  Call if not better after 2 tries ° °CALL IF YOU HAVE PROBLEMS/CONCERNS °Call if you are still struggling despite following these instructions. °Call if you have concerns not answered by these instructions ° °###################################################################### ° ° ° °1. DIET: Follow a light bland diet the first 24 hours after arrival home, such as soup, liquids, crackers, etc.  Be sure to include lots of fluids daily.  Avoid fast food or heavy meals as your are more likely to get nauseated.  Eat a low fat the next few days after surgery. °2. Take your usually prescribed home medications unless otherwise directed. °3. PAIN CONTROL: °a. Pain is best controlled by a usual combination of three different methods TOGETHER: °i. Ice/Heat °ii. Over the counter pain medication °iii. Prescription pain medication °b. Most patients will experience some swelling and bruising around the hernia(s) such as the bellybutton, groins, or old incisions.  Ice packs or heating pads (30-60 minutes up to 6 times a day) will help. Use ice for the first few days to help decrease swelling and bruising, then switch to heat to help relax tight/sore spots and speed recovery.  Some people prefer to use ice alone, heat alone, alternating between ice & heat.  Experiment to what works for you.  Swelling and bruising can take  several weeks to resolve.   °c. It is helpful to take an over-the-counter pain medication regularly for the first few weeks.  Choose one of the following that works best for you: °i. Naproxen (Aleve, etc)  Two 220mg tabs twice a day °ii. Ibuprofen (Advil, etc) Three 200mg tabs four times a day (every meal & bedtime) °iii. Acetaminophen (Tylenol, etc) 325-650mg four times a day (every meal & bedtime) °d. A  prescription for pain medication should be given to you upon discharge.  Take your pain medication as prescribed.  °i. If you are having problems/concerns with the prescription medicine (does not control pain, nausea, vomiting, rash, itching, etc), please call us [phone] to see if we need to switch you to a different pain medicine that will work better for you and/or control your side effect better. °ii. If you need a refill on your pain medication, please contact your pharmacy.  They will contact our office to request authorization. Prescriptions will not be filled after 5 pm or on week-ends. °4. Avoid getting constipated.  Between the surgery and the pain medications, it is common to experience some constipation.  Increasing fluid intake and taking a fiber supplement (such as Metamucil, Citrucel, FiberCon, MiraLax, etc) 1-2 times a day regularly will usually help prevent this problem from occurring.  A mild laxative (prune juice, Milk of Magnesia, MiraLax, etc) should be taken according to package directions if there are no bowel movements after 48 hours.   °5. Wash / shower every day.  You may shower over the dressings as they are waterproof.   °6. Remove   your waterproof bandages 5 days after surgery.  You may leave the incision open to air.  You may replace a dressing/Band-Aid to cover the incision for comfort if you wish.  Continue to shower over incision(s) after the dressing is off.    7. ACTIVITIES as tolerated:   a. You may resume regular (light) daily activities beginning the next day--such  as daily self-care, walking, climbing stairs--gradually increasing activities as tolerated.  If you can walk 30 minutes without difficulty, it is safe to try more intense activity such as jogging, treadmill, bicycling, low-impact aerobics, swimming, etc. b. Save the most intensive and strenuous activity for last such as sit-ups, heavy lifting, contact sports, etc  Refrain from any heavy lifting or straining until you are off narcotics for pain control.   c. DO NOT PUSH THROUGH PAIN.  Let pain be your guide: If it hurts to do something, don't do it.  Pain is your body warning you to avoid that activity for another week until the pain goes down. d. You may drive when you are no longer taking prescription pain medication, you can comfortably wear a seatbelt, and you can safely maneuver your car and apply brakes. e. Dennis Bast may have sexual intercourse when it is comfortable.  8. FOLLOW UP in our office a. Please call [facility] at [phone] to set up an appointment to see your surgeon in the office for a follow-up appointment approximately 2-3 weeks after your surgery. b. Make sure that you call for this appointment the day you arrive home to insure a convenient appointment time. 9.  IF YOU HAVE DISABILITY OR FAMILY LEAVE FORMS, BRING THEM TO THE OFFICE FOR PROCESSING.  DO NOT GIVE THEM TO YOUR DOCTOR.  WHEN TO CALL us [phone]: 1. Poor pain control 2. Reactions / problems with new medications (rash/itching, nausea, etc)  3. Fever over 101.5 F (38.5 C) 4. Inability to urinate 5. Nausea and/or vomiting 6. Worsening swelling or bruising 7. Continued bleeding from incision. 8. Increased pain, redness, or drainage from the incision   The clinic staff is available to answer your questions during regular business hours (8:30am-5pm).  Please dont hesitate to call and ask to speak to one of our nurses for clinical concerns.   If you have a medical emergency, go to the nearest emergency room or call  911.  A surgeon from [facility] is always on call at the hospitals in [facility], [place], [place], [place], [place]  [address] ?  [address] [address], [place], [place]   [address] MAIN: [phone] ? TOLL FREE: [phone] ? FAX: [phone] [email or web address] General Anesthesia, Adult, Care After These instructions provide you with information about caring for yourself after your procedure. Your health care provider may also give you more specific instructions. Your treatment has been planned according to current medical practices, but problems sometimes occur. Call your health care provider if you have any problems or questions after your procedure. What can I expect after the procedure? After the procedure, it is common to have:  Vomiting.  A sore throat.  Mental slowness. It is common to feel:  Nauseous.  Cold or shivery.  Sleepy.  Tired.  Sore or achy, even in parts of your body where you did not have surgery. Follow these instructions at home: For at least 24 hours after the procedure:  Do not:  Participate in activities where you could fall or become injured.  Drive.  Use heavy machinery.  Drink alcohol.  Take sleeping pills or medicines that cause drowsiness.  Make important decisions or sign legal documents.  Take care of children on your own.  Rest. Eating and drinking  If you vomit, drink water, juice, or soup when you can drink without vomiting.  Drink enough fluid to keep your urine clear or pale yellow.  Make sure you have little or no nausea before eating solid foods.  Follow the diet recommended by your health care provider. General instructions  Have a responsible adult stay with you until you are awake and alert.  Return to your normal activities as told by your health care provider. Ask your health care provider what activities are safe for you.  Take over-the-counter  and prescription medicines only as told by your health care provider.  If you smoke, do not smoke without supervision.  Keep all follow-up visits as told by your health care provider. This is important. Contact a health care provider if:  You continue to have nausea or vomiting at home, and medicines are not helpful.  You cannot drink fluids or start eating again.  You cannot urinate after 8-12 hours.  You develop a skin rash.  You have fever.  You have increasing redness at the site of your procedure. Get help right away if:  You have difficulty breathing.  You have chest pain.  You have unexpected bleeding.  You feel that you are having a life-threatening or urgent problem. This information is not intended to replace advice given to you by your health care provider. Make sure you discuss any questions you have with your health care provider. Document Released: 12/15/2000 Document Revised: 02/11/2016 Document Reviewed: 08/23/2015 Elsevier Interactive Patient Education  2017 [employer].

## 2016-08-21 NOTE — Op Note (Signed)
08/21/2016  11:43 AM  PATIENT:  Dennis Morris  50 y.o. male  Patient Care Team: Joretta Bachelor, PA as PCP - General (Physician Assistant) Michael Boston, MD as Consulting Physician (General Surgery)  PRE-OPERATIVE DIAGNOSIS:  Hernias in groins  POST-OPERATIVE DIAGNOSIS:  Hernias in groins  PROCEDURE:  Procedure(s): LAPAROSCOPIC EXPLORATION AND REPAIR  BILATERAL INGUINAL HERNIA REPAIR INSERTION OF MESH  SURGEON:  Surgeon(s): Michael Boston, MD  ASSISTANT: none   ANESTHESIA:   Regional ilioinguinal and genitofemoral and spermatic cord nerve blocks with GETA  EBL:  No intake/output data recorded.  Delay start of Pharmacological VTE agent (>24hrs) due to surgical blood loss or risk of bleeding:  no  DRAINS: NONE  SPECIMEN:  NONE  DISPOSITION OF SPECIMEN:  N/A  COUNTS:  YES  PLAN OF CARE: Discharge to home after PACU  PATIENT DISPOSITION:  PACU - hemodynamically stable.  INDICATION: Pleasant male with right groin pain and obvious radial hernia.  Exam suspicious for left renal hernia as well.  I recommended laparoscopic exploration and repair of hernias found  The anatomy & physiology of the abdominal wall and pelvic floor was discussed.  The pathophysiology of hernias in the inguinal and pelvic region was discussed.  Natural history risks such as progressive enlargement, pain, incarceration & strangulation was discussed.   Contributors to complications such as smoking, obesity, diabetes, prior surgery, etc were discussed.    I feel the risks of no intervention will lead to serious problems that outweigh the operative risks; therefore, I recommended surgery to reduce and repair the hernia.  I explained laparoscopic techniques with possible need for an open approach.  I noted usual use of mesh to patch and/or buttress hernia repair  Risks such as bleeding, infection, abscess, need for further treatment, heart attack, death, and other risks were discussed.  I noted a good  likelihood this will help address the problem.   Goals of post-operative recovery were discussed as well.  Possibility that this will not correct all symptoms was explained.  I stressed the importance of low-impact activity, aggressive pain control, avoiding constipation, & not pushing through pain to minimize risk of post-operative chronic pain or injury. Possibility of reherniation was discussed.  We will work to minimize complications.     An educational handout further explaining the pathology & treatment options was given as well.  Questions were answered.  The patient expresses understanding & wishes to proceed with surgery.  OR FINDINGS: Bilateral indirect inguinal hernias, right greater than left.  Pulse spermatic cord lipomas.  No direct space, femoral, nor obturator hernias.  DESCRIPTION:   The patient was identified & brought into the operating room. The patient was positioned supine with arms tucked. SCDs were active during the entire case. The patient underwent general anesthesia without any difficulty.  The abdomen was prepped and draped in a sterile fashion. The patient's bladder was emptied.  A Surgical Timeout confirmed our plan.  I made a transverse incision through the inferior umbilical fold.  I made a small transverse nick through the anterior rectus fascia contralateral to the inguinal hernia side and placed a 0-vicryl stitch through the fascia.  I placed a Hasson trocar into the preperitoneal plane.  Entry was clean.  We induced carbon dioxide insufflation. Camera inspection revealed no injury.  I used a 37mm angled scope to bluntly free the peritoneum off the infraumbilical anterior abdominal wall.  I created enough of a preperitoneal pocket to place 60mm ports into the right & left mid-abdomen  into this preperitoneal cavity.  I focused attention on the right side since that was the dominant hernia side.   I used blunt & focused sharp dissection to free the peritoneum off the  flank and down to the pubic rim.  I freed the anteriolateral bladder wall off the anteriolateral pelvic wall, sparing midline attachments.   I located a swath of peritoneum going into a hernia fascial defect at the internal ring consistent with an indirect inguinal hernia.  I gradually freed the peritoneal hernia sac off safely and reduced it into the preperitoneal space.  I freed the peritoneum off the spermatic vessels & vas deferens.  I freed peritoneum off the retroperitoneum along the psoas muscle.    I checked & assured hemostasis.  Did high ligation to help close the hernia sac data as well as some tack the spermatic cord lipoma down and protect the peritoneum.  I turned attention on the opposite side.  I did dissection in a similar, mirror-image fashion. The patient had a smaller but similar indirect left inguinal hernia.      I chose 15x15 cm sheets of ultra-lightweight polypropylene mesh (Ultrapro), one for each side.  I cut a single sigmoid-shaped slit ~6cm from a corner of each mesh.  I placed the meshes into the preperitoneal space & laid them as overlapping diamonds such that at the inferior points, a 6x6 cm corner flap rested in the true anterolateral pelvis, covering the obturator & femoral foramina.   I allowed the bladder to return to the pubis, this helping tuck the corners of the mesh in the anteriolateral pelvis.  The medial corners overlapped each other across midline cephalad to the pubic rim.   This provided >2 inch coverage around the hernia.  Because the defects well covered and not particularly large, I did not place any tacks.  I held the hernia sacs cephalad & evacuated carbon dioxide.  I closed the fascia with absorbable suture.  I closed the skin using 4-0 monocryl stitch.  Sterile dressings were applied.   The patient was extubated & arrived in the PACU in stable condition..  I had discussed postoperative care with the patient in the holding area.  Instructions are written in  the chart.  I discussed operative findings, updated the patient's status, discussed probable steps to recovery, and gave postoperative recommendations to the patient's spouse.  Recommendations were made.  Questions were answered.  She expressed understanding & appreciation.   Adin Hector, M.D., F.A.C.S. Gastrointestinal and Minimally Invasive Surgery [facility] [address], [place], [place] [address] [phone] Main / Paging

## 2016-08-21 NOTE — Anesthesia Postprocedure Evaluation (Signed)
Anesthesia Post Note  Patient: Dennis Morris  Procedure(s) Performed: Procedure(s) (LRB): LAPAROSCOPIC EXPLORATION AND REPAIR  BILATERAL INGUINAL HERNIA REPAIR (Bilateral) INSERTION OF MESH (Bilateral)  Patient location during evaluation: PACU Anesthesia Type: General Level of consciousness: awake Pain management: pain level controlled Respiratory status: spontaneous breathing Cardiovascular status: stable Anesthetic complications: no    Last Vitals:  Vitals:   08/21/16 1315 08/21/16 1330  BP: 101/69 112/77  Pulse: 70 70  Resp: 19 12  Temp:  36.6 C    Last Pain:  Vitals:   08/21/16 1300  PainSc: 2                  EDWARDS,Demetric Parslow

## 2016-08-21 NOTE — Transfer of Care (Signed)
Immediate Anesthesia Transfer of Care Note  Patient: Dennis Morris  Procedure(s) Performed: Procedure(s): LAPAROSCOPIC EXPLORATION AND REPAIR  BILATERAL INGUINAL HERNIA REPAIR (Bilateral) INSERTION OF MESH (Bilateral)  Patient Location: PACU  Anesthesia Type:General  Level of Consciousness: awake, alert  and oriented  Airway & Oxygen Therapy: Patient Spontanous Breathing and Patient connected to face mask oxygen  Post-op Assessment: Report given to RN and Post -op Vital signs reviewed and stable  Post vital signs: Reviewed and stable  Last Vitals: There were no vitals filed for this visit.  Last Pain:  Vitals:   08/21/16 0831  PainSc: 2       Patients Stated Pain Goal: 3 ([id number])  Complications: No apparent anesthesia complications

## 2016-08-21 NOTE — Interval H&P Note (Signed)
History and Physical Interval Note:  08/21/2016 9:40 AM  Dennis Morris  has presented today for surgery, with the diagnosis of Hernias in groins  The various methods of treatment have been discussed with the patient and family. After consideration of risks, benefits and other options for treatment, the patient has consented to  Procedure(s): Page (Bilateral) INSERTION OF MESH (Bilateral) as a surgical intervention .  The patient's history has been reviewed, patient examined, no change in status, stable for surgery.  I have reviewed the patient's chart and labs.  Questions were answered to the patient's satisfaction.     [name]

## 2016-08-21 NOTE — H&P (Addendum)
Dennis Morris  Location: [facility] Patient #: [id number] DOB: July 13, 1966 Married / Language: English / Race: White Male  Patient Care Team: Joretta Bachelor, PA as PCP - General (Physician Assistant) Michael Boston, MD as Consulting Physician (General Surgery)   History of Present Illness   The patient is a 50 year old male who presents with an inguinal hernia. Note for "Inguinal hernia":   Patient sent for surgical consultation by Ivar Drape, [facility]. Concern for right groin pain and probable inguinal hernia.  Pleasant active male. Works with an occupational therapy with lifting and other activity. Likes to exercise. Moderate kickboxing style exercising. Noticed some groin discomfort and swelling. Right side especially. Discuss with primary care physician had urgent center. Exam and ultrasound suspicious for retinal hernia. Patient has noticed a little bit of bulging on the left side recently. Surgical consultation requested.  He does not smoke anymore. He has never had abdominal or hernia surgery. No problems with urination or defecation. Usually moves his bowels about twice a day.   Other Problems Marjean Donna, CMA; 07/07/2016 3:53 PM) Other disease, cancer, significant illness  Past Surgical History Marjean Donna, [place]; 07/07/2016 3:53 PM) Colon Polyp Removal - Colonoscopy  Diagnostic Studies History Marjean Donna, CMA; 07/07/2016 3:53 PM) Colonoscopy 1-5 years ago  Allergies Davy Pique Bynum, CMA; 07/07/2016 3:53 PM) No Known Drug Allergies10/16/2017  Medication History (Sonya Bynum, CMA; 07/07/2016 3:54 PM) ZyrTEC Allergy (10MG  Capsule, Oral) Active. Excedrin Migraine (250-250-65MG  Tablet, Oral as needed) Active. Medications Reconciled  Social History Marjean Donna, CMA; 07/07/2016 3:53 PM) Alcohol use Occasional alcohol use. Caffeine use Carbonated beverages. No drug use Tobacco use Former  smoker.  Family History Marjean Donna, [place]; 07/07/2016 3:53 PM) Arthritis Father, Mother. Cancer Sister. Colon Cancer Father, Sister. Colon Polyps Brother, Father, Sister. Depression Mother. Diabetes Mellitus Father. Kidney Disease Father.    Review of Systems Davy Pique Bynum CMA; 07/07/2016 3:53 PM) General Not Present- Appetite Loss, Chills, Fatigue, Fever, Night Sweats, Weight Gain and Weight Loss. Skin Not Present- Change in Wart/Mole, Dryness, Hives, Jaundice, New Lesions, Non-Healing Wounds, Rash and Ulcer. HEENT Present- Seasonal Allergies. Not Present- Earache, Hearing Loss, Hoarseness, Nose Bleed, Oral Ulcers, Ringing in the Ears, Sinus Pain, Sore Throat, Visual Disturbances, Wears glasses/contact lenses and Yellow Eyes. Respiratory Not Present- Bloody sputum, Chronic Cough, Difficulty Breathing, Snoring and Wheezing. Breast Not Present- Breast Mass, Breast Pain, Nipple Discharge and Skin Changes. Cardiovascular Not Present- Chest Pain, Difficulty Breathing Lying Down, Leg Cramps, Palpitations, Rapid Heart Rate, Shortness of Breath and Swelling of Extremities. Gastrointestinal Present- Abdominal Pain. Not Present- Bloating, Bloody Stool, Change in Bowel Habits, Chronic diarrhea, Constipation, Difficulty Swallowing, Excessive gas, Gets full quickly at meals, Hemorrhoids, Indigestion, Nausea, Rectal Pain and Vomiting. Male Genitourinary Not Present- Blood in Urine, Change in Urinary Stream, Frequency, Impotence, Nocturia, Painful Urination, Urgency and Urine Leakage. Musculoskeletal Not Present- Back Pain, Joint Pain, Joint Stiffness, Muscle Pain, Muscle Weakness and Swelling of Extremities. Neurological Not Present- Decreased Memory, Fainting, Headaches, Numbness, Seizures, Tingling, Tremor, Trouble walking and Weakness. Psychiatric Not Present- Anxiety, Bipolar, Change in Sleep Pattern, Depression, Fearful and Frequent crying. Endocrine Not Present- Cold Intolerance, Excessive  Hunger, Hair Changes, Heat Intolerance, Hot flashes and New Diabetes. Hematology Not Present- Blood Thinners, Easy Bruising, Excessive bleeding, Gland problems, HIV and Persistent Infections.  Vitals (Sonya Bynum CMA; 07/07/2016 3:53 PM) 07/07/2016 3:53 PM Weight: 154.4 lb Height: 70in Body Surface Area: 1.87 m Body Mass Index: 22.15 kg/m  Temp.: 98.88F(Temporal)  Pulse: 74 (Regular)  BP: 126/80 (Sitting, Left Arm, Standard)  Ht 5\' 10"  (1.778 m)   Wt 72.1 kg (159 lb)   BMI 22.81 kg/m       Physical Exam Adin Hector MD; 07/07/2016 4:13 PM) General Mental Status-Alert. General Appearance-Not in acute distress, Not Sickly. Orientation-Oriented X3. Hydration-Well hydrated. Voice-Normal.  Integumentary Global Assessment Upon inspection and palpation of skin surfaces of the - Axillae: non-tender, no inflammation or ulceration, no drainage. and Distribution of scalp and body hair is normal. General Characteristics Temperature - normal warmth is noted.  Head and Neck Head-normocephalic, atraumatic with no lesions or palpable masses. Face Global Assessment - atraumatic, no absence of expression. Neck Global Assessment - no abnormal movements, no bruit auscultated on the right, no bruit auscultated on the left, no decreased range of motion, non-tender. Trachea-midline. Thyroid Gland Characteristics - non-tender.  Eye Eyeball - Left-Extraocular movements intact, No Nystagmus. Eyeball - Right-Extraocular movements intact, No Nystagmus. Cornea - Left-No Hazy. Cornea - Right-No Hazy. Sclera/Conjunctiva - Left-No scleral icterus, No Discharge. Sclera/Conjunctiva - Right-No scleral icterus, No Discharge. Pupil - Left-Direct reaction to light normal. Pupil - Right-Direct reaction to light normal.  ENMT Ears Pinna - Left - no drainage observed, no generalized tenderness observed. Right - no drainage observed, no generalized  tenderness observed. Nose and Sinuses External Inspection of the Nose - no destructive lesion observed. Inspection of the nares - Left - quiet respiration. Right - quiet respiration. Mouth and Throat Lips - Upper Lip - no fissures observed, no pallor noted. Lower Lip - no fissures observed, no pallor noted. Nasopharynx - no discharge present. Oral Cavity/Oropharynx - Tongue - no dryness observed. Oral Mucosa - no cyanosis observed. Hypopharynx - no evidence of airway distress observed.  Chest and Lung Exam Inspection Movements - Normal and Symmetrical. Accessory muscles - No use of accessory muscles in breathing. Palpation Palpation of the chest reveals - Non-tender. Auscultation Breath sounds - Normal and Clear.  Cardiovascular Auscultation Rhythm - Regular. Murmurs & Other Heart Sounds - Auscultation of the heart reveals - No Murmurs and No Systolic Clicks.  Abdomen Inspection Inspection of the abdomen reveals - No Visible peristalsis and No Abnormal pulsations. Umbilicus - No Bleeding, No Urine drainage. Palpation/Percussion Palpation and Percussion of the abdomen reveal - Soft, Non Tender, No Rebound tenderness, No Rigidity (guarding) and No Cutaneous hyperesthesia. Note: No inguinal hernias. Normal external genitalia. Epididymi, testes, and spermatic cords normal without any masses.   Male Genitourinary Sexual Maturity Tanner 5 - Adult hair pattern and Adult penile size and shape. Note: Right greater than left bilateral inguinal hernias. Sensitive but reducible. Focused mainly in the groin. Normal external genitalia. Epididymi, testes, and spermatic cords normal without any masses.   Peripheral Vascular Upper Extremity Inspection - Left - No Cyanotic nailbeds, Not Ischemic. Right - No Cyanotic nailbeds, Not Ischemic.  Neurologic Neurologic evaluation reveals -normal attention span and ability to concentrate, able to name objects and repeat phrases. Appropriate fund of  knowledge , normal sensation and normal coordination. Mental Status Affect - not angry, not paranoid. Cranial Nerves-Normal Bilaterally. Gait-Normal.  Neuropsychiatric Mental status exam performed with findings of-able to articulate well with normal speech/language, rate, volume and coherence, thought content normal with ability to perform basic computations and apply abstract reasoning and no evidence of hallucinations, delusions, obsessions or homicidal/suicidal ideation.  Musculoskeletal Global Assessment Spine, Ribs and Pelvis - no instability, subluxation or laxity. Right Upper Extremity - no instability, subluxation or laxity.  Lymphatic Head &  Neck  General Head & Neck Lymphatics: Bilateral - Description - No Localized lymphadenopathy. Axillary  General Axillary Region: Bilateral - Description - No Localized lymphadenopathy. Femoral & Inguinal  Generalized Femoral & Inguinal Lymphatics: Left - Description - No Localized lymphadenopathy. Right - Description - No Localized lymphadenopathy.    Assessment & Plan   BILATERAL INGUINAL HERNIA WITHOUT OBSTRUCTION OR GANGRENE, RECURRENCE NOT SPECIFIED (K40.20) Impression: Right greater than left bilateral inguinal hernias. Sensitive.  I think he would benefit from surgical repair. He wishes to proceed as soon as possible so he can get back to work and back to his intense exercise. 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 I did caution that he probably be 2 weeks before light duty in 6 weeks before unrestricted activity. He will check with work to see if there is any a formal FMLA like paperwork etc. Needed.   PREOP - ING HERNIA - ENCOUNTER FOR PREOPERATIVE EXAMINATION FOR GENERAL SURGICAL PROCEDURE (Z01.818) Current Plans You are being scheduled for surgery - Our schedulers will call you.  You should hear from our office's scheduling department within 5 working days about  the location, date, and time of surgery. We try to make accommodations for patient's preferences in scheduling surgery, but sometimes the OR schedule or the surgeon's schedule prevents [place] from making those accommodations.  If you have not heard from our office [phone]) in 5 working days, call the office and ask for your surgeon's nurse.  If you have other questions about your diagnosis, plan, or surgery, call the office and ask for your surgeon's nurse.  Written instructions provided The anatomy & physiology of the abdominal wall and pelvic floor was discussed. The pathophysiology of hernias in the inguinal and pelvic region was discussed. Natural history risks such as progressive enlargement, pain, incarceration, and strangulation was discussed. Contributors to complications such as smoking, obesity, diabetes, prior surgery, etc were discussed.  I feel the risks of no intervention will lead to serious problems that outweigh the operative risks; therefore, I recommended surgery to reduce and repair the hernia. I explained laparoscopic techniques with possible need for an open approach. I noted usual use of mesh to patch and/or buttress hernia repair  Risks such as bleeding, infection, abscess, need for further treatment, heart attack, death, and other risks were discussed. I noted a good likelihood this will help address the problem. Goals of post-operative recovery were discussed as well. Possibility that this will not correct all symptoms was explained. I stressed the importance of low-impact activity, aggressive pain control, avoiding constipation, & not pushing through pain to minimize risk of post-operative chronic pain or injury. Possibility of reherniation was discussed. We will work to minimize complications.  An educational handout further explaining the pathology & treatment options was given as well. Questions were answered. The patient expresses understanding & wishes to proceed with  surgery.  Pt Education - Pamphlet Given - Laparoscopic Hernia Repair: discussed with patient and provided information. Pt Education - [facility]CS Pain Control (Shalondra Wunschel) Pt Education - [facility] Hernia Post-Op HCI (Jorja Empie): discussed with patient and provided information.  I have re-reviewed the the patient's records, history, medications, and allergies.  I have re-examined the patient.  I again discussed intraoperative plans and goals of post-operative recovery.  The patient agrees to proceed.   Adin Hector, M.D., F.A.C.S. Gastrointestinal and Minimally Invasive Surgery [facility] [address], [place],  [address] [phone] Main / Paging

## 2016-08-21 NOTE — Anesthesia Preprocedure Evaluation (Addendum)
Anesthesia Evaluation  Patient identified by MRN, date of birth, ID band Patient awake    Reviewed: Allergy & Precautions, NPO status , Patient's Chart, lab work & pertinent test results  Airway Mallampati: II  TM Distance: >3 FB     Dental   Pulmonary neg pulmonary ROS, former smoker,    breath sounds clear to auscultation       Cardiovascular negative cardio ROS   Rhythm:Regular Rate:Normal     Neuro/Psych  Headaches,    GI/Hepatic Neg liver ROS, History noted. CE   Endo/Other  negative endocrine ROS  Renal/GU negative Renal ROS     Musculoskeletal   Abdominal   Peds  Hematology   Anesthesia Other Findings   Reproductive/Obstetrics                             Anesthesia Physical Anesthesia Plan  ASA: II  Anesthesia Plan: General   Post-op Pain Management:    Induction: Intravenous  Airway Management Planned: Oral ETT  Additional Equipment:   Intra-op Plan:   Post-operative Plan: Extubation in OR  Informed Consent: I have reviewed the patients History and Physical, chart, labs and discussed the procedure including the risks, benefits and alternatives for the proposed anesthesia with the patient or authorized representative who has indicated his/her understanding and acceptance.   Dental advisory given  Plan Discussed with: CRNA and Anesthesiologist  Anesthesia Plan Comments:         Anesthesia Quick Evaluation

## 2016-08-22 ENCOUNTER — Encounter (HOSPITAL_COMMUNITY): Payer: Self-pay | Admitting: Surgery

## 2017-08-19 ENCOUNTER — Other Ambulatory Visit: Payer: Self-pay

## 2017-08-19 ENCOUNTER — Ambulatory Visit (INDEPENDENT_AMBULATORY_CARE_PROVIDER_SITE_OTHER): Payer: 59 | Admitting: Physician Assistant

## 2017-08-19 ENCOUNTER — Encounter: Payer: Self-pay | Admitting: Physician Assistant

## 2017-08-19 VITALS — BP 118/64 | HR 77 | Temp 98.4°F | Resp 18 | Ht 70.0 in | Wt 163.6 lb

## 2017-08-19 DIAGNOSIS — Z125 Encounter for screening for malignant neoplasm of prostate: Secondary | ICD-10-CM

## 2017-08-19 DIAGNOSIS — Z130 Encounter for screening for diseases of the blood and blood-forming organs and certain disorders involving the immune mechanism: Secondary | ICD-10-CM

## 2017-08-19 DIAGNOSIS — Z23 Encounter for immunization: Secondary | ICD-10-CM | POA: Diagnosis not present

## 2017-08-19 DIAGNOSIS — Z1211 Encounter for screening for malignant neoplasm of colon: Secondary | ICD-10-CM

## 2017-08-19 DIAGNOSIS — Z13228 Encounter for screening for other metabolic disorders: Secondary | ICD-10-CM

## 2017-08-19 DIAGNOSIS — Z0000 Encounter for general adult medical examination without abnormal findings: Secondary | ICD-10-CM | POA: Diagnosis not present

## 2017-08-19 DIAGNOSIS — Z13220 Encounter for screening for lipoid disorders: Secondary | ICD-10-CM | POA: Diagnosis not present

## 2017-08-19 DIAGNOSIS — Z1329 Encounter for screening for other suspected endocrine disorder: Secondary | ICD-10-CM

## 2017-08-19 NOTE — Progress Notes (Signed)
PRIMARY CARE AT [facility] [address] [place] [address] [phone]  Date:  08/19/2017   Name:  Dennis Morris   DOB:  12-Dec-1965   MRN:  [id number]  PCP:  Joretta Bachelor, PA    History of Present Illness:  Dennis Morris is a 51 y.o. male patient who presents to PCP with  Chief Complaint  Patient presents with  . Annual Exam      DIET: he eats "what I want".  He eats very little pork.  He rarely eats fried foods.  Gets vegetables in couple times per week.  Water intake: 64 oz of water per day about.  Occasional soda.  Once per month cup of coffee.    BM: normal.  No blood or black stool.  No constipation or diarrhea.    URINATION: no dysuria, hematuria.  He has noticed an added frequency--getting up around 330am.    SLEEP: sleeps pretty good 8 hours per night.    SOCIAL ACTIVITY: plays 38 year old daughter Dennis Morris to read and play video games.   Exercise: 3 times per week of cardio.  He has laid off on some of his exercise such as "9 round"  EtOH: 2-5 drinks per week. Tobacco or vaping: 2 years ago, former smoker.  <1ppd.   Illicit drug RAQ:TMAU Sexually active: no difficulty.      Patient Active Problem List   Diagnosis Date Noted  . Bilateral inguinal hernias s/p lap repair with mesh 08/21/2016 08/21/2016    Past Medical History:  Diagnosis Date  . Allergy   . Arthritis    right elbow occ. pain  . Cataract    hereditary  . Headache    occ. none frequent    Past Surgical History:  Procedure Laterality Date  . ELBOW FRACTURE SURGERY Right    surgery to repair and then to debride after infection age 48  . HERNIA REPAIR    . INGUINAL HERNIA REPAIR Bilateral 08/21/2016   Procedure: LAPAROSCOPIC EXPLORATION AND REPAIR  BILATERAL INGUINAL HERNIA REPAIR;  Surgeon: Michael Boston, MD;  Location: [facility];  Service: General;  Laterality: Bilateral;  . INSERTION OF MESH Bilateral 08/21/2016   Procedure: INSERTION OF MESH;  Surgeon: Michael Boston, MD;  Location: [facility];   Service: General;  Laterality: Bilateral;    Social History   Tobacco Use  . Smoking status: Former Smoker    Last attempt to quit: 08/19/2011    Years since quitting: 6.0  . Smokeless tobacco: Never Used  Substance Use Topics  . Alcohol use: Yes    Comment: rare -social  . Drug use: No    Family History  Problem Relation Age of Onset  . Arthritis Mother   . Kidney disease Father   . Cancer Father   . Diabetes Father   . Arthritis Maternal Grandmother   . Heart disease Maternal Grandfather   . Hyperlipidemia Maternal Grandfather   . Cancer Paternal Grandmother   . Diabetes Sister     No Known Allergies  Medication list has been reviewed and updated.  Current Outpatient Medications on File Prior to Visit  Medication Sig Dispense Refill  . aspirin-acetaminophen-caffeine (EXCEDRIN MIGRAINE) 250-250-65 MG per tablet Take 1 tablet by mouth every 6 (six) hours as needed for migraine.     . bismuth subsalicylate (PEPTO BISMOL) 262 MG/15ML suspension Take 30 mLs by mouth daily as needed for indigestion or diarrhea or loose stools.    . calcium carbonate (TUMS - DOSED IN  MG ELEMENTAL CALCIUM) 500 MG chewable tablet Chew 1 tablet by mouth daily as needed for indigestion or heartburn.    . naproxen (NAPROSYN) 500 MG tablet Take 1 tablet (500 mg total) by mouth every 12 (twelve) hours as needed for mild pain or moderate pain. (Patient not taking: Reported on 08/19/2017) 30 tablet 1  . traMADol (ULTRAM) 50 MG tablet Take 1-2 tablets (50-100 mg total) by mouth every 6 (six) hours as needed for moderate pain or severe pain. (Patient not taking: Reported on 08/19/2017) 30 tablet 0   No current facility-administered medications on file prior to visit.     Review of Systems  Constitutional: Negative for chills and fever.  HENT: Negative for ear discharge, ear pain and sore throat.   Eyes: Negative for blurred vision and double vision.  Respiratory: Negative for cough, shortness of  breath and wheezing.   Cardiovascular: Negative for chest pain, palpitations and leg swelling.  Gastrointestinal: Negative for diarrhea, nausea and vomiting.  Genitourinary: Negative for dysuria, frequency and hematuria.  Skin: Negative for itching and rash.  Neurological: Negative for dizziness and headaches.   ROS otherwise unremarkable unless listed above.  Physical Examination: BP 118/64   Pulse 77   Temp 98.4 F (36.9 C) (Oral)   Resp 18   Ht 5' 10" (1.778 m)   Wt 163 lb 9.6 oz (74.2 kg)   SpO2 97%   BMI 23.47 kg/m  Ideal Body Weight: Weight in (lb) to have BMI = 25: 173.9 Wt Readings from Last 3 Encounters:  08/19/17 163 lb 9.6 oz (74.2 kg)  08/21/16 159 lb (72.1 kg)  08/18/16 159 lb (72.1 kg)    Physical Exam  Constitutional: He is oriented to person, place, and time. He appears well-developed and well-nourished. No distress.  HENT:  Head: Normocephalic and atraumatic.  Right Ear: Tympanic membrane, external ear and ear canal normal.  Left Ear: Tympanic membrane, external ear and ear canal normal.  Eyes: Conjunctivae and EOM are normal. Pupils are equal, round, and reactive to light.  Cardiovascular: Normal rate and regular rhythm. Exam reveals no friction rub.  No murmur heard. Pulmonary/Chest: Effort normal. No respiratory distress. He has no wheezes.  Abdominal: Soft. Bowel sounds are normal. He exhibits no distension and no mass. There is no tenderness.  Musculoskeletal: Normal range of motion. He exhibits no edema or tenderness.  Neurological: He is alert and oriented to person, place, and time. He displays normal reflexes.  Skin: Skin is warm and dry. He is not diaphoretic.  Psychiatric: He has a normal mood and affect. His behavior is normal.     Assessment and Plan: Dennis Morris is a 51 y.o. male who is here today for cc of  Chief Complaint  Patient presents with  . Annual Exam   Annual physical exam - Plan: Ambulatory referral to Gastroenterology,  CBC, CMP14+EGFR, TSH, PSA, Lipid panel  Screening for colon cancer - Plan: Ambulatory referral to Gastroenterology  Screening for deficiency anemia - Plan: CBC  Screening for metabolic disorder - Plan: CMP14+EGFR  Screening for thyroid disorder - Plan: TSH  Screening for prostate cancer - Plan: PSA  Screening for lipid disorders - Plan: Lipid panel  Ivar Drape, PA-C [facility] 12/4/20188:18 AM

## 2017-08-19 NOTE — Patient Instructions (Addendum)
Keeping you healthy  Get these tests  Blood pressure- Have your blood pressure checked once a year by your healthcare provider.  Normal blood pressure is 120/80  Weight- Have your body mass index (BMI) calculated to screen for obesity.  BMI is a measure of body fat based on height and weight. You can also calculate your own BMI at www.nhlbisuport.com/bmi/.  Cholesterol- Have your cholesterol checked every year.  Diabetes- Have your blood sugar checked regularly if you have high blood pressure, high cholesterol, have a family history of diabetes or if you are overweight.  Screening for Colon Cancer- Colonoscopy starting at age 50.  Screening may begin sooner depending on your family history and other health conditions. Follow up colonoscopy as directed by your Gastroenterologist.  Screening for Prostate Cancer- Both blood work (PSA) and a rectal exam help screen for Prostate Cancer.  Screening begins at age 40 with African-American men and at age 50 with Caucasian men.  Screening may begin sooner depending on your family history.  Take these medicines  Aspirin- One aspirin daily can help prevent Heart disease and Stroke.  Flu shot- Every fall.  Tetanus- Every 10 years.  Zostavax- Once after the age of 60 to prevent Shingles.  Pneumonia shot- Once after the age of 65; if you are younger than 65, ask your healthcare provider if you need a Pneumonia shot.  Take these steps  Don't smoke- If you do smoke, talk to your doctor about quitting.  For tips on how to quit, go to www.smokefree.gov or call 1-800-QUIT-NOW.  Be physically active- Exercise 5 days a week for at least 30 minutes.  If you are not already physically active start slow and gradually work up to 30 minutes of moderate physical activity.  Examples of moderate activity include walking briskly, mowing the yard, dancing, swimming, bicycling, etc.  Eat a healthy diet- Eat a variety of healthy food such as fruits, vegetables,  low fat milk, low fat cheese, yogurt, lean meant, poultry, fish, beans, tofu, etc. For more information go to www.thenutritionsource.org  Drink alcohol in moderation- Limit alcohol intake to less than two drinks a day. Never drink and drive.  Dentist- Brush and floss twice daily; visit your dentist twice a year.  Depression- Your emotional health is as important as your physical health. If you're feeling down, or losing interest in things you would normally enjoy please talk to your healthcare provider.  Eye exam- Visit your eye doctor every year.  Safe sex- If you may be exposed to a sexually transmitted infection, use a condom.  Seat belts- Seat belts can save your life; always wear one.  Smoke/Carbon Monoxide detectors- These detectors need to be installed on the appropriate level of your home.  Replace batteries at least once a year.  Skin cancer- When out in the sun, cover up and use sunscreen 15 SPF or higher.  Violence- If anyone is threatening you, please tell your healthcare provider.  Living Will/ Health care power of attorney- Speak with your healthcare provider and family.   IF you received an x-ray today, you will receive an invoice from [facility]. Please contact [facility] at [phone] with questions or concerns regarding your invoice.   IF you received labwork today, you will receive an invoice from [facility]. Please contact [facility] at [phone] with questions or concerns regarding your invoice.   Our billing staff will not be able to assist you with questions regarding bills from these companies.  You will be contacted   with the lab results as soon as they are available. The fastest way to get your results is to activate your My Chart account. Instructions are located on the last page of this paperwork. If you have not heard from us regarding the results in 2 weeks, please contact this office.     

## 2017-08-22 ENCOUNTER — Ambulatory Visit (INDEPENDENT_AMBULATORY_CARE_PROVIDER_SITE_OTHER): Payer: 59 | Admitting: Physician Assistant

## 2017-08-22 DIAGNOSIS — Z1329 Encounter for screening for other suspected endocrine disorder: Secondary | ICD-10-CM

## 2017-08-22 DIAGNOSIS — Z13220 Encounter for screening for lipoid disorders: Secondary | ICD-10-CM

## 2017-08-22 DIAGNOSIS — Z13228 Encounter for screening for other metabolic disorders: Secondary | ICD-10-CM

## 2017-08-22 DIAGNOSIS — Z114 Encounter for screening for human immunodeficiency virus [HIV]: Secondary | ICD-10-CM

## 2017-08-22 DIAGNOSIS — Z130 Encounter for screening for diseases of the blood and blood-forming organs and certain disorders involving the immune mechanism: Secondary | ICD-10-CM

## 2017-08-22 DIAGNOSIS — Z1389 Encounter for screening for other disorder: Secondary | ICD-10-CM

## 2017-08-22 NOTE — Progress Notes (Signed)
Was not fasting at his CPE 08/19/2017. Returns today for fasting labs. No orders placed. Patient understands that he was to have "routine" labs.  Orders Placed This Encounter  Procedures  . CBC with Differential/Platelet  . Comprehensive metabolic panel    Order Specific Question:   Has the patient fasted?    Answer:   Yes  . Lipid panel    Order Specific Question:   Has the patient fasted?    Answer:   Yes  . TSH  . HIV antibody  . Urinalysis, dipstick only

## 2017-08-23 LAB — COMPREHENSIVE METABOLIC PANEL
A/G RATIO: 1.9 (ref 1.2–2.2)
ALBUMIN: 4.6 g/dL (ref 3.5–5.5)
ALT: 19 IU/L (ref 0–44)
AST: 20 IU/L (ref 0–40)
Alkaline Phosphatase: 45 IU/L (ref 39–117)
BILIRUBIN TOTAL: 0.5 mg/dL (ref 0.0–1.2)
BUN / CREAT RATIO: 15 (ref 9–20)
BUN: 14 mg/dL (ref 6–24)
CHLORIDE: 103 mmol/L (ref 96–106)
CO2: 25 mmol/L (ref 20–29)
Calcium: 9.2 mg/dL (ref 8.7–10.2)
Creatinine, Ser: 0.91 mg/dL (ref 0.76–1.27)
GFR calc non Af Amer: 98 mL/min/{1.73_m2} (ref >59)
GFR, EST AFRICAN AMERICAN: 113 mL/min/{1.73_m2} (ref >59)
Globulin, Total: 2.4 g/dL (ref 1.5–4.5)
Glucose: 92 mg/dL (ref 65–99)
POTASSIUM: 4.3 mmol/L (ref 3.5–5.2)
SODIUM: 141 mmol/L (ref 134–144)
TOTAL PROTEIN: 7 g/dL (ref 6.0–8.5)

## 2017-08-23 LAB — TSH: TSH: 2.34 u[IU]/mL (ref 0.450–4.500)

## 2017-08-23 LAB — LIPID PANEL
Chol/HDL Ratio: 3.1 ratio (ref 0.0–5.0)
Cholesterol, Total: 182 mg/dL (ref 100–199)
HDL: 59 mg/dL (ref >39)
LDL Calculated: 100 mg/dL — ABNORMAL HIGH (ref 0–99)
Triglycerides: 115 mg/dL (ref 0–149)
VLDL Cholesterol Cal: 23 mg/dL (ref 5–40)

## 2017-08-23 LAB — URINALYSIS, DIPSTICK ONLY
BILIRUBIN UA: NEGATIVE
GLUCOSE, UA: NEGATIVE
KETONES UA: NEGATIVE
Leukocytes, UA: NEGATIVE
Nitrite, UA: NEGATIVE
Protein, UA: NEGATIVE
RBC, UA: NEGATIVE
Specific Gravity, UA: 1.02 (ref 1.005–1.030)
UUROB: 0.2 mg/dL (ref 0.2–1.0)
pH, UA: 5.5 (ref 5.0–7.5)

## 2017-08-23 LAB — CBC WITH DIFFERENTIAL/PLATELET
Basophils Absolute: 0 10*3/uL (ref 0.0–0.2)
Basos: 1 %
EOS (ABSOLUTE): 0.2 10*3/uL (ref 0.0–0.4)
EOS: 2 %
HEMOGLOBIN: 14.6 g/dL (ref 13.0–17.7)
Hematocrit: 42.3 % (ref 37.5–51.0)
IMMATURE GRANS (ABS): 0 10*3/uL (ref 0.0–0.1)
Immature Granulocytes: 0 %
Lymphocytes Absolute: 1.9 10*3/uL (ref 0.7–3.1)
Lymphs: 30 %
MCH: 30.8 pg (ref 26.6–33.0)
MCHC: 34.5 g/dL (ref 31.5–35.7)
MCV: 89 fL (ref 79–97)
MONOCYTES: 8 %
MONOS ABS: 0.5 10*3/uL (ref 0.1–0.9)
NEUTROS PCT: 59 %
Neutrophils Absolute: 3.7 10*3/uL (ref 1.4–7.0)
PLATELETS: 288 10*3/uL (ref 150–379)
RBC: 4.74 x10E6/uL (ref 4.14–5.80)
RDW: 14 % (ref 12.3–15.4)
WBC: 6.3 10*3/uL (ref 3.4–10.8)

## 2017-08-23 LAB — HIV ANTIBODY (ROUTINE TESTING W REFLEX): HIV SCREEN 4TH GENERATION: NONREACTIVE

## 2017-09-29 ENCOUNTER — Encounter: Payer: Self-pay | Admitting: Physician Assistant

## 2017-10-07 IMAGING — US US PELVIS LIMITED
1 series · 14 of 19 positions shown · non-contrast
Comparison: None in PACs

CLINICAL DATA: Right groin pain and swelling since [REDACTED] of this
year possibly related to weight lifting.

EXAM:
LIMITED ULTRASOUND OF PELVIS
TECHNIQUE: Limited transabdominal ultrasound examination of the pelvis was
performed.

[Series 1: us pelvis limited · 0.12mm/px · 19 acquisitions, 14 frames shown]
[im 1/19]
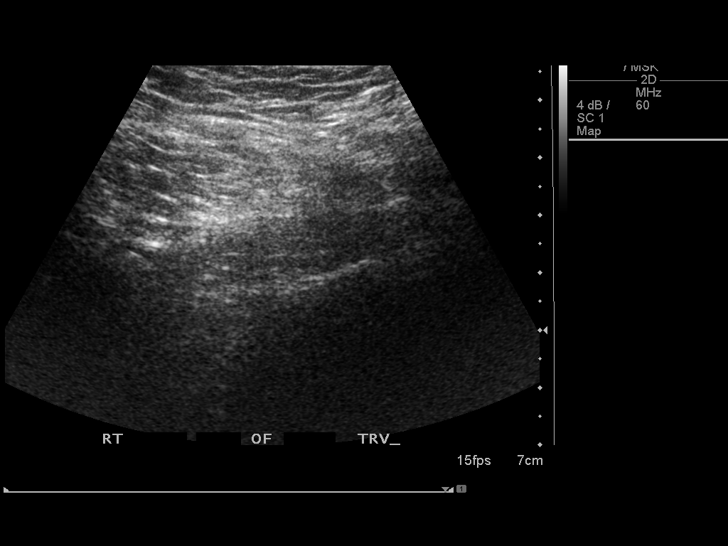
[im 3/19]
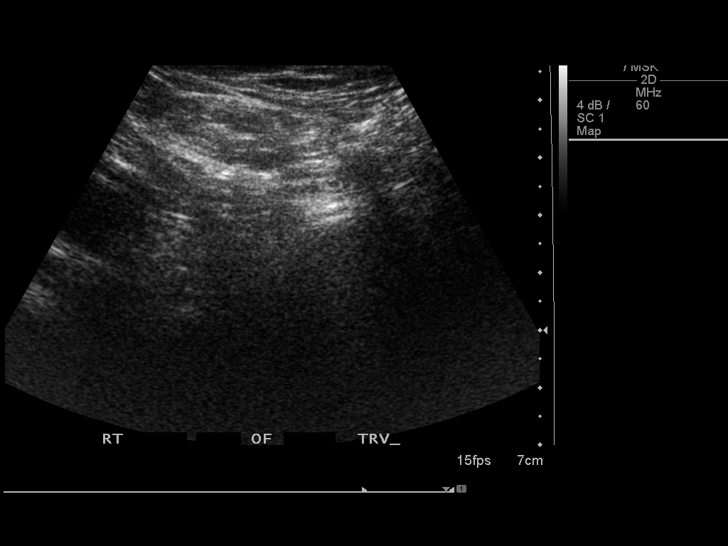
[im 4/19]
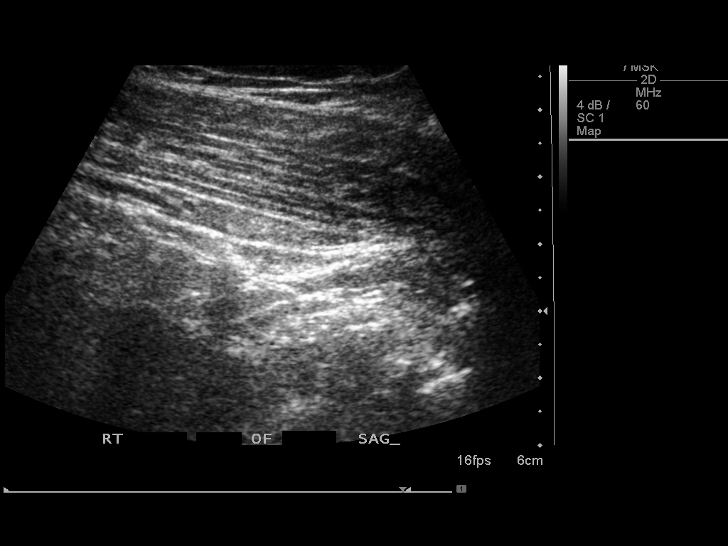
[im 5/19]
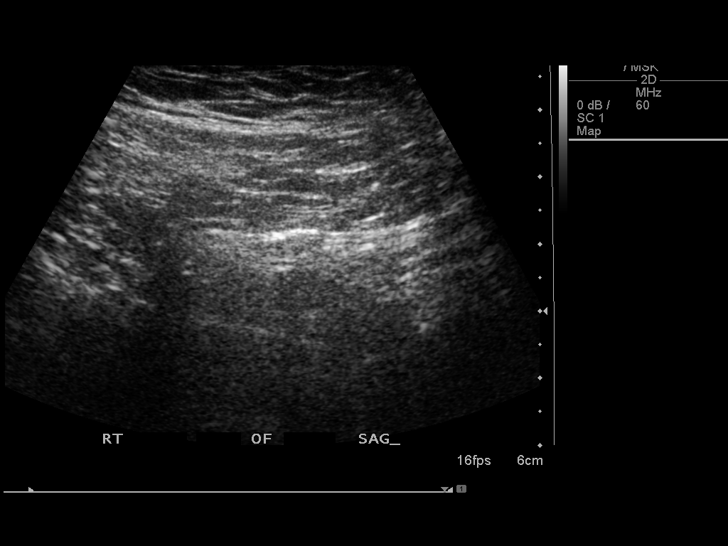
[im 7/19]
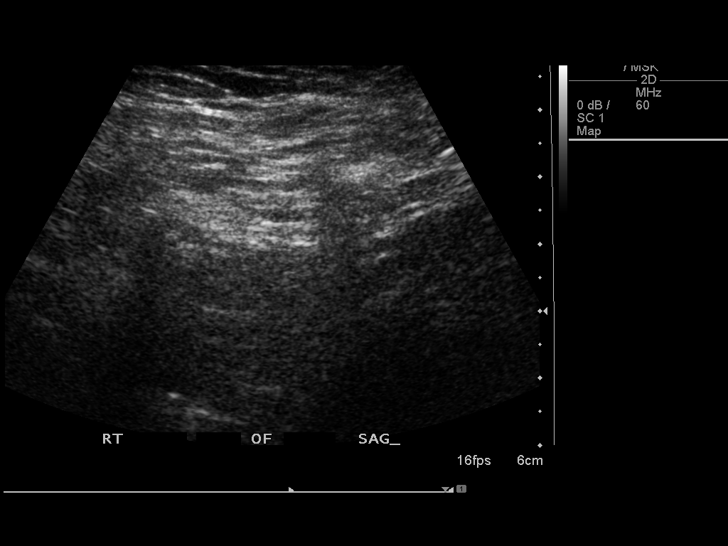
[im 8/19]
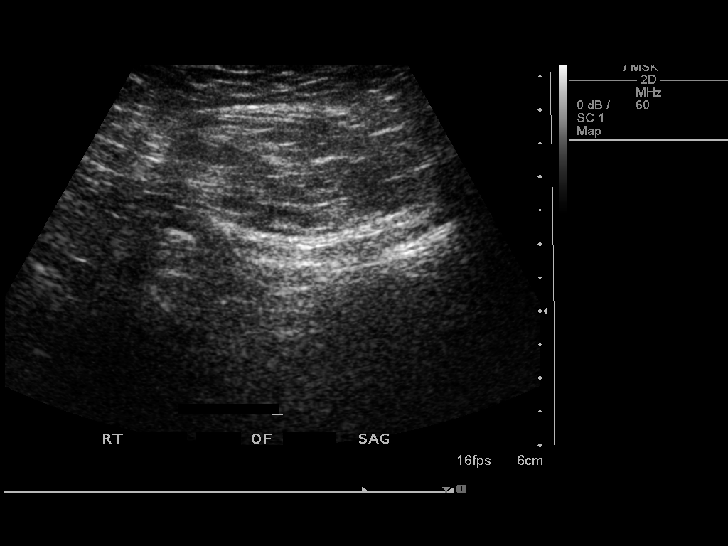
[im 9/19]
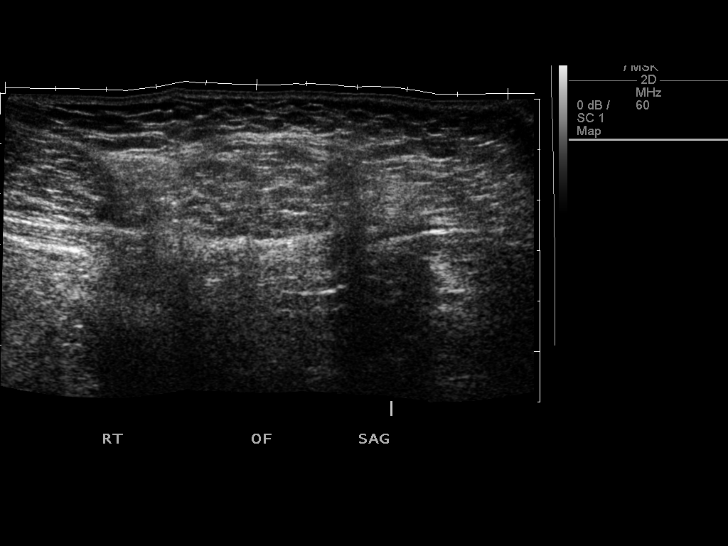
[im 11/19]
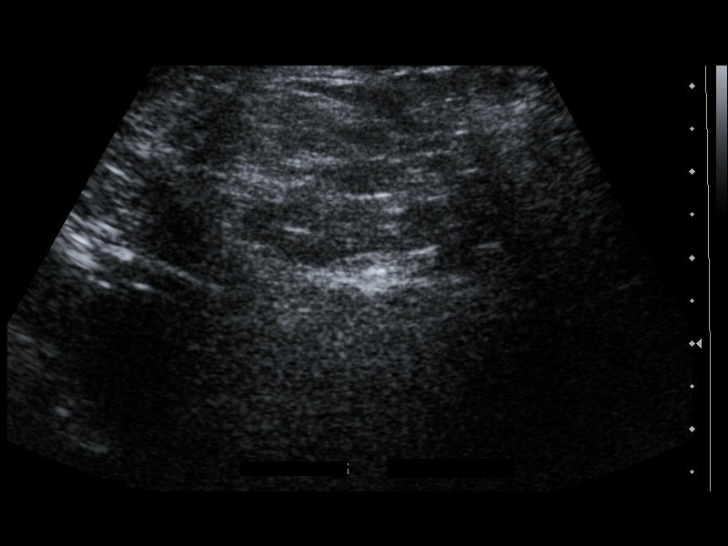
[im 12/19]
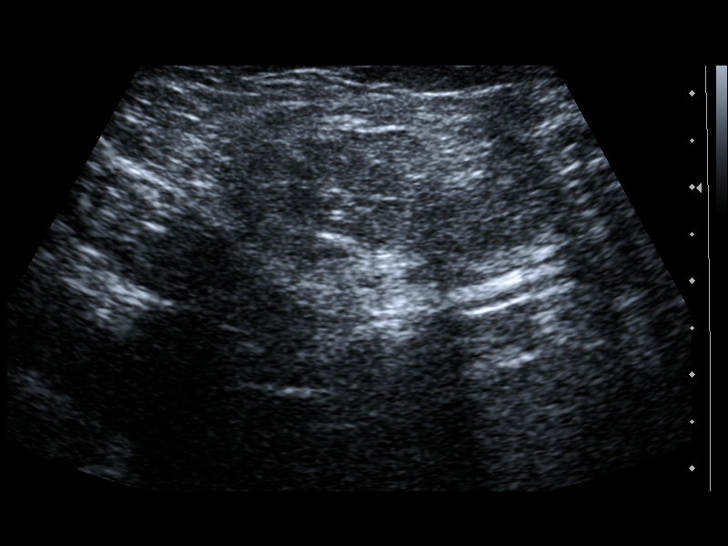
[im 13/19]
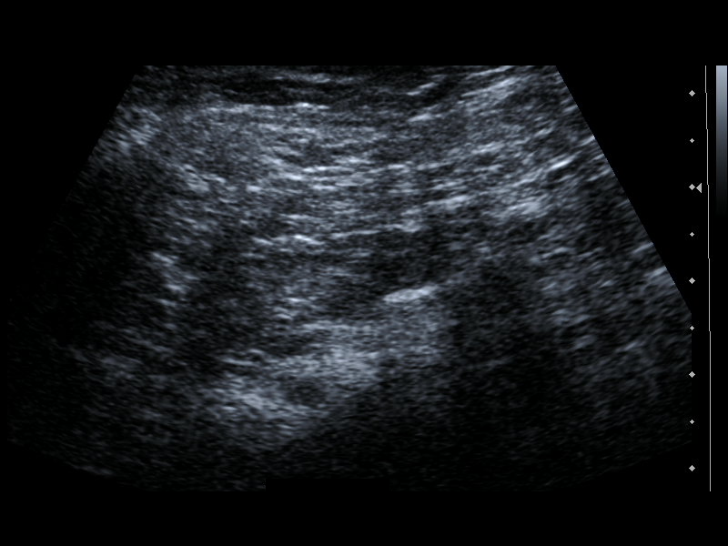
[im 15/19]
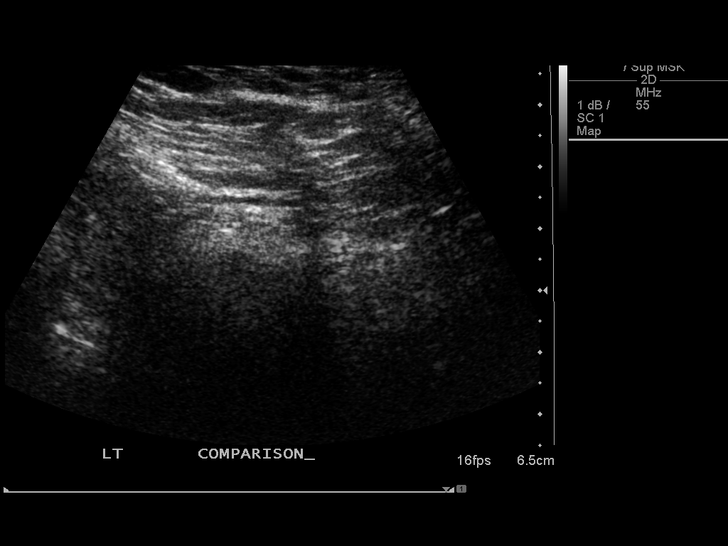
[im 16/19]
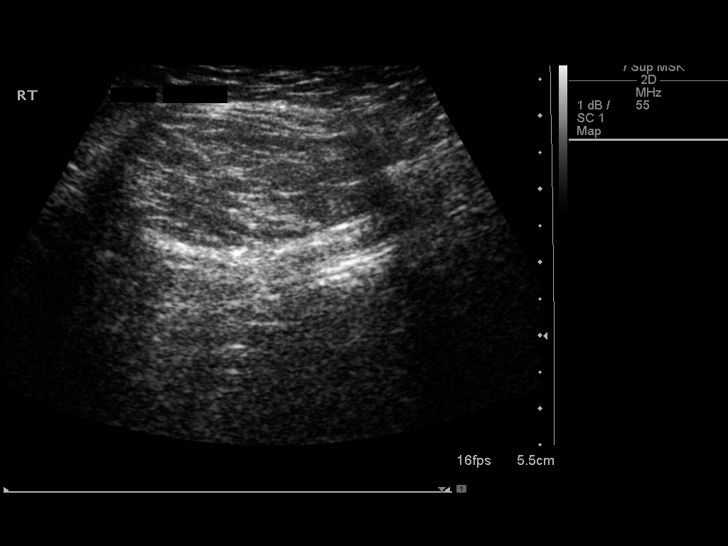
[im 17/19]
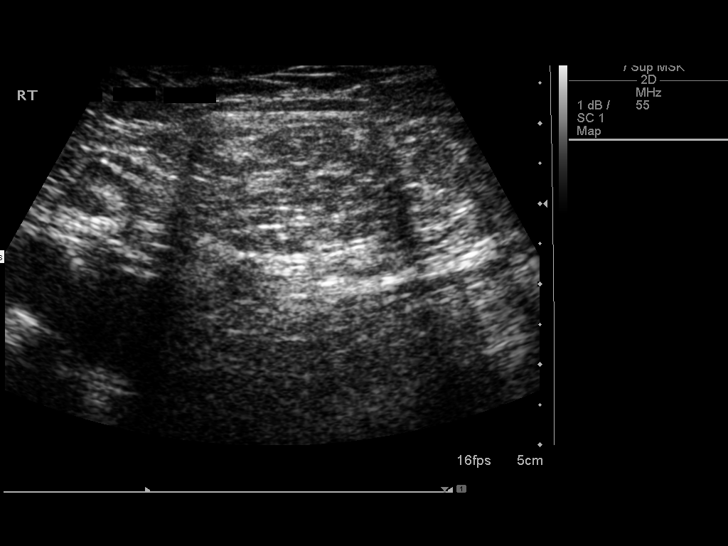
[im 19/19]
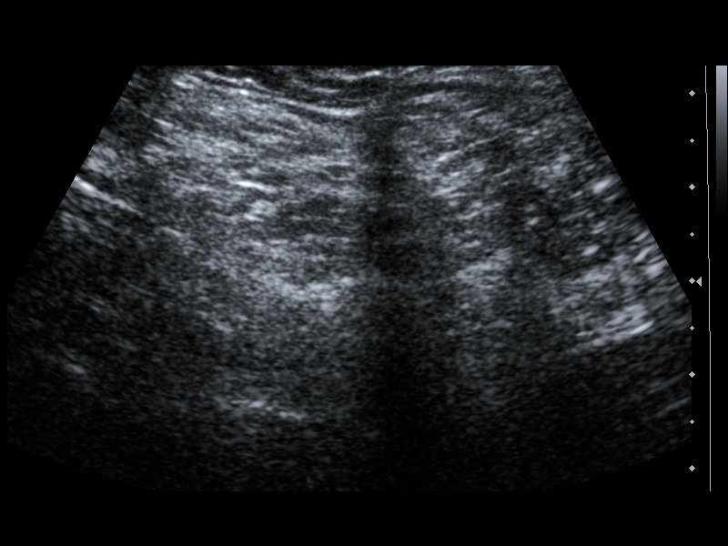

[14 of 19 positions shown; findings below may reference images not displayed]

FINDINGS: In the right groin there is a palpable fullness reported by the
technologist. This exhibits echotexture compatible with fat. No
bowel is observed within the hernia sac. No similar findings are
observed on the left.
IMPRESSION: Findings compatible with a right inguinal hernia containing fat.

## 2017-12-23 ENCOUNTER — Encounter: Payer: Self-pay | Admitting: Physician Assistant

## 2022-05-12 ENCOUNTER — Ambulatory Visit: Payer: 59 | Admitting: Nurse Practitioner

## 2022-05-19 ENCOUNTER — Encounter: Payer: Self-pay | Admitting: Internal Medicine

## 2022-05-19 ENCOUNTER — Encounter: Payer: Self-pay | Admitting: Nurse Practitioner

## 2022-05-19 ENCOUNTER — Ambulatory Visit (INDEPENDENT_AMBULATORY_CARE_PROVIDER_SITE_OTHER): Payer: Self-pay | Admitting: Nurse Practitioner

## 2022-05-19 VITALS — BP 138/88 | HR 72 | Temp 97.1°F | Resp 20 | Ht 70.0 in | Wt 158.9 lb

## 2022-05-19 DIAGNOSIS — Z1211 Encounter for screening for malignant neoplasm of colon: Secondary | ICD-10-CM

## 2022-05-19 DIAGNOSIS — J302 Other seasonal allergic rhinitis: Secondary | ICD-10-CM

## 2022-05-19 DIAGNOSIS — Z13220 Encounter for screening for lipoid disorders: Secondary | ICD-10-CM

## 2022-05-19 DIAGNOSIS — Z1212 Encounter for screening for malignant neoplasm of rectum: Secondary | ICD-10-CM

## 2022-05-19 DIAGNOSIS — Z0000 Encounter for general adult medical examination without abnormal findings: Secondary | ICD-10-CM

## 2022-05-19 DIAGNOSIS — Z1159 Encounter for screening for other viral diseases: Secondary | ICD-10-CM

## 2022-05-19 DIAGNOSIS — Z800 Family history of malignant neoplasm of digestive organs: Secondary | ICD-10-CM

## 2022-05-19 LAB — CBC WITH DIFFERENTIAL/PLATELET
Absolute Monocytes: 416 cells/uL (ref 200–950)
Basophils Relative: 0.8 %
Eosinophils Absolute: 111 cells/uL (ref 15–500)
Eosinophils Relative: 1.7 %
HCT: 43.7 % (ref 38.5–50.0)
Lymphs Abs: 1385 cells/uL (ref 850–3900)
MCH: 30.8 pg (ref 27.0–33.0)
MCHC: 34.8 g/dL (ref 32.0–36.0)
MCV: 88.5 fL (ref 80.0–100.0)
MPV: 9.5 fL (ref 7.5–12.5)
Neutrophils Relative %: 69.8 %
WBC: 6.5 10*3/uL (ref 3.8–10.8)

## 2022-05-19 NOTE — Patient Instructions (Addendum)
SHINGLES VACCINE at local pharmacy  Flu vaccine through [facility]  Make sure to visit dentist every 6 months.   If you do not hear back about referral in a week please call our office back and ask for an update on referral.

## 2022-05-19 NOTE — Progress Notes (Signed)
Careteam: Patient Care Team: Michael Boston, MD as Consulting Physician (General Surgery)  PLACE OF SERVICE:  [facility] Directive information    No Known Allergies  Chief Complaint  Patient presents with   New Patient (Initial Visit)    Patient presents today for a new patient appointment.     HPI: Patient is a 56 y.o. male to establish care.  He has not had routine primary care previously. Would go to urgent care if needed  Has seasonal allergies- will use flonase when he has symptoms.   Will occasionally have headache, joint pains and use ibuprofen as needed- not using daily  [facility]  Uses progressive lens  Has not been to dentist since pandemic.   Review of Systems:  Review of Systems  Constitutional:  Negative for chills, fever and weight loss.  HENT:  Negative for tinnitus.   Respiratory:  Negative for cough, sputum production and shortness of breath.   Cardiovascular:  Negative for chest pain, palpitations and leg swelling.  Gastrointestinal:  Negative for abdominal pain, constipation, diarrhea and heartburn.  Genitourinary:  Negative for dysuria, frequency and urgency.  Musculoskeletal:  Negative for back pain, falls, joint pain and myalgias.  Skin: Negative.   Neurological:  Negative for dizziness and headaches.  Psychiatric/Behavioral:  Negative for depression and memory loss. The patient does not have insomnia.     Past Medical History:  Diagnosis Date   Allergy    Arthritis    right elbow occ. pain   Cataract    hereditary   Headache    occ. none frequent   Past Surgical History:  Procedure Laterality Date   ELBOW FRACTURE SURGERY Right    surgery to repair and then to debride after infection age 72   HERNIA REPAIR     INGUINAL HERNIA REPAIR Bilateral 08/21/2016   Procedure: LAPAROSCOPIC EXPLORATION AND REPAIR  BILATERAL INGUINAL HERNIA REPAIR;  Surgeon: Michael Boston, MD;  Location: [facility];   Service: General;  Laterality: Bilateral;   INSERTION OF MESH Bilateral 08/21/2016   Procedure: INSERTION OF MESH;  Surgeon: Michael Boston, MD;  Location: [facility];  Service: General;  Laterality: Bilateral;   Social History:   reports that he quit smoking about 10 years ago. His smoking use included cigarettes. He has never used smokeless tobacco. He reports current alcohol use. He reports that he does not use drugs.  Family History  Problem Relation Age of Onset   Arthritis Mother    Kidney disease Father    Diabetes Father    Colon cancer Father    Blindness Father    Colon cancer Sister    Diabetes Sister    Arthritis Maternal Grandmother    Heart disease Maternal Grandfather    Hyperlipidemia Maternal Grandfather    Colon cancer Paternal Grandmother     Medications: Patient's Medications  New Prescriptions   No medications on file  Previous Medications   FLUTICASONE (FLONASE) 50 MCG/ACT NASAL SPRAY    Place 1 spray into both nostrils See admin instructions. During the Spring & Fall.   IBUPROFEN (ADVIL) 200 MG TABLET    Take 200 mg by mouth as needed for fever, headache, mild pain or moderate pain.  Modified Medications   No medications on file  Discontinued Medications   No medications on file    Physical Exam:  Vitals:   05/19/22 0854  BP: 138/88  Pulse: 72  Resp: 20  Temp: Marland Kitchen)  97.1 F (36.2 C)  SpO2: 98%  Weight: 158 lb 14.4 oz (72.1 kg)  Height: 5' 10"  (1.778 m)   Body mass index is 22.8 kg/m. Wt Readings from Last 3 Encounters:  05/19/22 158 lb 14.4 oz (72.1 kg)  08/19/17 163 lb 9.6 oz (74.2 kg)  08/21/16 159 lb (72.1 kg)    Physical Exam Constitutional:      General: He is not in acute distress.    Appearance: He is well-developed. He is not diaphoretic.  HENT:     Head: Normocephalic and atraumatic.     Right Ear: External ear normal.     Left Ear: External ear normal.     Mouth/Throat:     Pharynx: No oropharyngeal exudate.  Eyes:      Conjunctiva/sclera: Conjunctivae normal.     Pupils: Pupils are equal, round, and reactive to light.  Cardiovascular:     Rate and Rhythm: Normal rate and regular rhythm.     Heart sounds: Normal heart sounds.  Pulmonary:     Effort: Pulmonary effort is normal.     Breath sounds: Normal breath sounds.  Abdominal:     General: Bowel sounds are normal.     Palpations: Abdomen is soft.  Musculoskeletal:        General: No tenderness.     Cervical back: Normal range of motion and neck supple.     Right lower leg: No edema.     Left lower leg: No edema.  Skin:    General: Skin is warm and dry.  Neurological:     Mental Status: He is alert and oriented to person, place, and time.     Labs reviewed: Basic Metabolic Panel: No results for input(s): "NA", "K", "CL", "CO2", "GLUCOSE", "BUN", "CREATININE", "CALCIUM", "MG", "PHOS", "TSH" in the last 8760 hours. Liver Function Tests: No results for input(s): "AST", "ALT", "ALKPHOS", "BILITOT", "PROT", "ALBUMIN" in the last 8760 hours. No results for input(s): "LIPASE", "AMYLASE" in the last 8760 hours. No results for input(s): "AMMONIA" in the last 8760 hours. CBC: No results for input(s): "WBC", "NEUTROABS", "HGB", "HCT", "MCV", "PLT" in the last 8760 hours. Lipid Panel: No results for input(s): "CHOL", "HDL", "LDLCALC", "TRIG", "CHOLHDL", "LDLDIRECT" in the last 8760 hours. TSH: No results for input(s): "TSH" in the last 8760 hours. A1C: No results found for: "HGBA1C"   Assessment/Plan 1. Encounter for colorectal cancer screening - Ambulatory referral to Gastroenterology  2. Family history of colon cancer -Last colonoscopy was 9 years ago, recommended 5 year follow up - Ambulatory referral to Gastroenterology  3. Preventative health care -wellness visit completed today, The patient was counseled regarding the appropriate use of alcohol, regular self-examination of the breasts on a monthly basis, prevention of dental and  periodontal disease, diet, regular sustained exercise for at least 30 minutes 5 times per week, r testicular self-examination on a monthly basis,smoking cessation, tobacco use,  and recommended schedule for GI hemoccult testing, colonoscopy, cholesterol, thyroid and diabetes screening. - Lipid panel - CBC with Differential/Platelet - CMP with eGFR([facility])  4. Lipid screening -will get screening today  5. Seasonal allergies -stable continue current regimen.   6. Need for hepatitis C screening test - Hepatitis C antibody   Return in about 1 year (around 05/20/2023) for CPE with labs at appt . Carlos American. [place], [facility] [phone]

## 2022-05-20 LAB — CBC WITH DIFFERENTIAL/PLATELET
Basophils Absolute: 52 cells/uL (ref 0–200)
Hemoglobin: 15.2 g/dL (ref 13.2–17.1)
Monocytes Relative: 6.4 %
Neutro Abs: 4537 cells/uL (ref 1500–7800)
Platelets: 296 10*3/uL (ref 140–400)
RBC: 4.94 10*6/uL (ref 4.20–5.80)
RDW: 12.6 % (ref 11.0–15.0)
Total Lymphocyte: 21.3 %

## 2022-05-20 LAB — LIPID PANEL
Cholesterol: 199 mg/dL (ref <200)
HDL: 64 mg/dL (ref >=40)
LDL Cholesterol (Calc): 115 mg/dL (calc) — ABNORMAL HIGH
Non-HDL Cholesterol (Calc): 135 mg/dL (calc) — ABNORMAL HIGH (ref <130)
Total CHOL/HDL Ratio: 3.1 (calc) (ref <5.0)
Triglycerides: 92 mg/dL (ref <150)

## 2022-05-20 LAB — COMPLETE METABOLIC PANEL WITH GFR
AG Ratio: 1.6 (calc) (ref 1.0–2.5)
ALT: 10 U/L (ref 9–46)
AST: 15 U/L (ref 10–35)
Albumin: 4.4 g/dL (ref 3.6–5.1)
Alkaline phosphatase (APISO): 47 U/L (ref 35–144)
BUN: 15 mg/dL (ref 7–25)
CO2: 25 mmol/L (ref 20–32)
Calcium: 8.9 mg/dL (ref 8.6–10.3)
Chloride: 105 mmol/L (ref 98–110)
Creat: 1.03 mg/dL (ref 0.70–1.30)
Globulin: 2.7 g/dL (calc) (ref 1.9–3.7)
Glucose, Bld: 92 mg/dL (ref 65–99)
Potassium: 4.5 mmol/L (ref 3.5–5.3)
Sodium: 140 mmol/L (ref 135–146)
Total Bilirubin: 0.5 mg/dL (ref 0.2–1.2)
Total Protein: 7.1 g/dL (ref 6.1–8.1)
eGFR: 86 mL/min/{1.73_m2} (ref >=60)

## 2022-05-20 LAB — HEPATITIS C ANTIBODY: Hepatitis C Ab: NONREACTIVE

## 2022-05-30 ENCOUNTER — Ambulatory Visit (AMBULATORY_SURGERY_CENTER): Payer: Self-pay | Admitting: *Deleted

## 2022-05-30 VITALS — Ht 70.0 in | Wt 154.0 lb

## 2022-05-30 DIAGNOSIS — Z800 Family history of malignant neoplasm of digestive organs: Secondary | ICD-10-CM

## 2022-05-30 MED ORDER — NA SULFATE-K SULFATE-MG SULF 17.5-3.13-1.6 GM/177ML PO SOLN: 1.0000 | Freq: Once | ORAL | 0 refills | Status: AC

## 2022-05-30 NOTE — Progress Notes (Signed)
No egg or soy allergy known to patient  No issues known to pt with past sedation with any surgeries or procedures Patient denies ever being told they had issues or difficulty with intubation  No FH of Malignant Hyperthermia Pt is not on diet pills Pt is not on home 02  Pt is not on blood thinners  Pt denies issues with constipation  No A fib or A flutter Have any cardiac testing pending--NO Pt instructed to use Singlecare.com or GoodRx for a price reduction on prep   

## 2022-07-03 ENCOUNTER — Encounter: Payer: Self-pay | Admitting: Internal Medicine

## 2022-07-11 ENCOUNTER — Ambulatory Visit (AMBULATORY_SURGERY_CENTER): Payer: No Typology Code available for payment source | Admitting: Internal Medicine

## 2022-07-11 ENCOUNTER — Encounter: Payer: Self-pay | Admitting: Internal Medicine

## 2022-07-11 VITALS — BP 114/68 | HR 77 | Temp 97.5°F | Resp 16 | Ht 70.0 in | Wt 154.0 lb

## 2022-07-11 DIAGNOSIS — D122 Benign neoplasm of ascending colon: Secondary | ICD-10-CM

## 2022-07-11 DIAGNOSIS — D125 Benign neoplasm of sigmoid colon: Secondary | ICD-10-CM | POA: Diagnosis not present

## 2022-07-11 DIAGNOSIS — Z800 Family history of malignant neoplasm of digestive organs: Secondary | ICD-10-CM

## 2022-07-11 DIAGNOSIS — Z1211 Encounter for screening for malignant neoplasm of colon: Secondary | ICD-10-CM | POA: Diagnosis not present

## 2022-07-11 DIAGNOSIS — D123 Benign neoplasm of transverse colon: Secondary | ICD-10-CM | POA: Diagnosis not present

## 2022-07-11 MED ORDER — SODIUM CHLORIDE 0.9 % IV SOLN: 500.0000 mL | Freq: Once | INTRAVENOUS | Status: DC

## 2022-07-11 NOTE — Patient Instructions (Signed)
-   Discharge patient to home (with escort). - Await pathology results. - The findings and recommendations were discussed with the patient. -Handout on hemorrhoids, diverticulosis and polyps  YOU HAD AN ENDOSCOPIC PROCEDURE TODAY AT [facility]:   Refer to the procedure report that was given to you for any specific questions about what was found during the examination.  If the procedure report does not answer your questions, please call your gastroenterologist to clarify.  If you requested that your care partner not be given the details of your procedure findings, then the procedure report has been included in a sealed envelope for you to review at your convenience later.  YOU SHOULD EXPECT: Some feelings of bloating in the abdomen. Passage of more gas than usual.  Walking can help get rid of the air that was put into your GI tract during the procedure and reduce the bloating. If you had a lower endoscopy (such as a colonoscopy or flexible sigmoidoscopy) you may notice spotting of blood in your stool or on the toilet paper. If you underwent a bowel prep for your procedure, you may not have a normal bowel movement for a few days.  Please Note:  You might notice some irritation and congestion in your nose or some drainage.  This is from the oxygen used during your procedure.  There is no need for concern and it should clear up in a day or so.  SYMPTOMS TO REPORT IMMEDIATELY:  Following lower endoscopy (colonoscopy or flexible sigmoidoscopy):  Excessive amounts of blood in the stool  Significant tenderness or worsening of abdominal pains  Swelling of the abdomen that is new, acute  Fever of 100F or higher   For urgent or emergent issues, a gastroenterologist can be reached at any hour by calling [phone]. Do not use MyChart messaging for urgent concerns.    DIET:  We do recommend a small meal at first, but then you may proceed to your regular diet.  Drink plenty of fluids  but you should avoid alcoholic beverages for 24 hours.  ACTIVITY:  You should plan to take it easy for the rest of today and you should NOT DRIVE or use heavy machinery until tomorrow (because of the sedation medicines used during the test).    FOLLOW UP: Our staff will call the number listed on your records the next business day following your procedure.  We will call around 7:15- 8:00 am to check on you and address any questions or concerns that you may have regarding the information given to you following your procedure. If we do not reach you, we will leave a message.     If any biopsies were taken you will be contacted by phone or by letter within the next 1-3 weeks.  Please call us at [phone] if you have not heard about the biopsies in 3 weeks.    SIGNATURES/CONFIDENTIALITY: You and/or your care partner have signed paperwork which will be entered into your electronic medical record.  These signatures attest to the fact that that the information above on your After Visit Summary has been reviewed and is understood.  Full responsibility of the confidentiality of this discharge information lies with you and/or your care-partner.

## 2022-07-11 NOTE — Progress Notes (Signed)
GASTROENTEROLOGY PROCEDURE H&P NOTE   Primary Care Physician: Lauree Chandler, NP    Reason for Procedure:   Colon cancer screening, family history of colon cancer  Plan:    Colonoscopy   Patient is appropriate for endoscopic procedure(s) in the ambulatory ([facility]) setting.  The nature of the procedure, as well as the risks, benefits, and alternatives were carefully and thoroughly reviewed with the patient. Ample time for discussion and questions allowed. The patient understood, was satisfied, and agreed to proceed.     HPI: Dennis Morris is a 56 y.o. male who presents for colonoscopy for colon cancer screening. Denies blood in stools, changes in bowel habits, weight loss. Sister (age 14), father (age 57s) and grandmother had colon cancer. Last colonoscopy was 9 years ago with polyps.  Past Medical History:  Diagnosis Date   Allergy    SEASONAL   Arthritis    right elbow occ. pain   Cataract    hereditary   Headache    occ. none frequent    Past Surgical History:  Procedure Laterality Date   COLONOSCOPY  2014   ELBOW FRACTURE SURGERY Right    surgery to repair and then to debride after infection age 49   HERNIA REPAIR     INGUINAL HERNIA REPAIR Bilateral 08/21/2016   Procedure: LAPAROSCOPIC EXPLORATION AND REPAIR  BILATERAL INGUINAL HERNIA REPAIR;  Surgeon: Michael Boston, MD;  Location: [facility];  Service: General;  Laterality: Bilateral;   INSERTION OF MESH Bilateral 08/21/2016   Procedure: INSERTION OF MESH;  Surgeon: Michael Boston, MD;  Location: [facility];  Service: General;  Laterality: Bilateral;    Prior to Admission medications   Medication Sig Start Date End Date Taking? Authorizing Provider  acetaminophen (TYLENOL) 325 MG tablet Take 650 mg by mouth every 6 (six) hours as needed.   Yes [provider]  fluticasone (FLONASE) 50 MCG/ACT nasal spray Place 1 spray into both nostrils See admin instructions. During the Spring & Fall.   Yes [provider]  Aspirin-Acetaminophen-Caffeine (EXCEDRIN PO) Take by mouth as needed.    [provider]  ibuprofen (ADVIL) 200 MG tablet Take 200 mg by mouth as needed for fever, headache, mild pain or moderate pain.    [provider]  Multiple Vitamins-Minerals (MULTIVITAMIN GUMMIES ADULT PO) Take by mouth.    [provider]    Current Outpatient Medications  Medication Sig Dispense Refill   acetaminophen (TYLENOL) 325 MG tablet Take 650 mg by mouth every 6 (six) hours as needed.     fluticasone (FLONASE) 50 MCG/ACT nasal spray Place 1 spray into both nostrils See admin instructions. During the Spring & Fall.     Aspirin-Acetaminophen-Caffeine (EXCEDRIN PO) Take by mouth as needed.     ibuprofen (ADVIL) 200 MG tablet Take 200 mg by mouth as needed for fever, headache, mild pain or moderate pain.     Multiple Vitamins-Minerals (MULTIVITAMIN GUMMIES ADULT PO) Take by mouth.     Current Facility-Administered Medications  Medication Dose Route Frequency Provider Last Rate Last Admin   0.9 %  sodium chloride infusion  500 mL Intravenous Once Sharyn Creamer, MD        Allergies as of 07/11/2022   (No Known Allergies)    Family History  Problem Relation Age of Onset   Arthritis Mother    Diverticulitis Mother    Kidney disease Father    Diabetes Father    Colon cancer Father    Blindness Father  Colon cancer Sister    Diabetes Sister    Arthritis Maternal Grandmother    Heart disease Maternal Grandfather    Hyperlipidemia Maternal Grandfather    Colon cancer Paternal Grandmother    Crohn's disease Neg Hx    Esophageal cancer Neg Hx    Rectal cancer Neg Hx    Stomach cancer Neg Hx    Ulcerative colitis Neg Hx     Social History   Socioeconomic History   Marital status: Married    Spouse name: Not on file   Number of children: Not on file   Years of education: Not on file   Highest education level: Not on file  Occupational History   Not on file   Tobacco Use   Smoking status: Former    Packs/day: 0.50    Years: 20.00    Total pack years: 10.00    Types: Cigarettes    Quit date: 08/19/2011    Years since quitting: 10.9    Passive exposure: Never   Smokeless tobacco: Never  Vaping Use   Vaping Use: Never used  Substance and Sexual Activity   Alcohol use: Yes    Comment: rare -social   Drug use: No   Sexual activity: Yes  Other Topics Concern   Not on file  Social History Narrative   Not on file   Social Determinants of Health   Financial Resource Strain: Not on file  Food Insecurity: Not on file  Transportation Needs: Not on file  Physical Activity: Not on file  Stress: Not on file  Social Connections: Not on file  Intimate Partner Violence: Not on file    Physical Exam: Vital signs in last 24 hours: BP 120/78   Pulse 62   Temp (!) 97.5 F (36.4 C) (Temporal)   Ht '5\' 10"'$  (1.778 m)   Wt 154 lb (69.9 kg)   SpO2 98%   BMI 22.10 kg/m  GEN: NAD EYE: Sclerae anicteric ENT: MMM CV: Non-tachycardic Pulm: No increased work of breathing GI: Soft, NT/ND NEURO:  Alert & Oriented   Christia Reading, MD [facility]  07/11/2022 3:34 PM

## 2022-07-11 NOTE — Progress Notes (Signed)
Pt's states no medical or surgical changes since previsit or office visit. 

## 2022-07-11 NOTE — Op Note (Signed)
[facility] Patient Name: Saw Mendenhall Procedure Date: 07/11/2022 3:38 PM MRN: [id number] Endoscopist: Sonny Masters "Christia Reading ,  Age: 56 Referring MD:  Date of Birth: [birth year] Gender: Male Account #: [id number] Procedure:                Colonoscopy Indications:              Screening patient at increased risk: Family history                            of colorectal cancer in multiple 1st-degree                            relatives Medicines:                Monitored Anesthesia Care Procedure:                Pre-Anesthesia Assessment:                           - Prior to the procedure, a History and Physical                            was performed, and patient medications and                            allergies were reviewed. The patient's tolerance of                            previous anesthesia was also reviewed. The risks                            and benefits of the procedure and the sedation                            options and risks were discussed with the patient.                            All questions were answered, and informed consent                            was obtained. Prior Anticoagulants: The patient has                            taken no previous anticoagulant or antiplatelet                            agents. ASA Grade Assessment: II - A patient with                            mild systemic disease. After reviewing the risks                            and benefits, the patient was deemed in  satisfactory condition to undergo the procedure.                           After obtaining informed consent, the colonoscope                            was passed under direct vision. Throughout the                            procedure, the patient's blood pressure, pulse, and                            oxygen saturations were monitored continuously. The                            PCF-HQ190L Colonoscope was introduced through the                             anus and advanced to the the terminal ileum. The                            colonoscopy was performed without difficulty. The                            patient tolerated the procedure well. The quality                            of the bowel preparation was adequate. The terminal                            ileum, ileocecal valve, appendiceal orifice, and                            rectum were photographed. Scope In: 3:42:20 PM Scope Out: 4:00:10 PM Scope Withdrawal Time: 0 hours 14 minutes 59 seconds  Total Procedure Duration: 0 hours 17 minutes 50 seconds  Findings:                 The terminal ileum appeared normal.                           Two sessile polyps were found in the transverse                            colon and ascending colon. The polyps were 3 to 4                            mm in size. These polyps were removed with a cold                            snare. Resection and retrieval were complete.                           A 3 mm polyp was found in the sigmoid  colon. The                            polyp was sessile. The polyp was removed with a                            cold snare. Resection and retrieval were complete.                           Multiple diverticula were found in the sigmoid                            colon, descending colon and ascending colon.                           Non-bleeding internal hemorrhoids were found during                            retroflexion. Complications:            No immediate complications. Estimated Blood Loss:     Estimated blood loss was minimal. Impression:               - The examined portion of the ileum was normal.                           - Two 3 to 4 mm polyps in the transverse colon and                            in the ascending colon, removed with a cold snare.                            Resected and retrieved.                           - One 3 mm polyp in the sigmoid colon, removed with                             a cold snare. Resected and retrieved.                           - Diverticulosis in the sigmoid colon, in the                            descending colon and in the ascending colon.                           - Non-bleeding internal hemorrhoids. Recommendation:           - Discharge patient to home (with escort).                           - Await pathology results.                           - The findings and  recommendations were discussed                            with the patient. Dr Georgian Co "Lyndee Leo" Lorenso Courier,  07/11/2022 4:03:34 PM

## 2022-07-11 NOTE — Progress Notes (Signed)
Called to room to assist during endoscopic procedure.  Patient ID and intended procedure confirmed with present staff. Received instructions for my participation in the procedure from the performing physician.  

## 2022-07-11 NOTE — Progress Notes (Signed)
PT taken to PACU. Monitors in place. VSS. Report given to RN. 

## 2022-07-14 ENCOUNTER — Telehealth: Payer: Self-pay

## 2022-07-14 NOTE — Telephone Encounter (Signed)
Left message on follow up call. 

## 2022-07-16 ENCOUNTER — Encounter: Payer: Self-pay | Admitting: Internal Medicine

## 2023-02-27 ENCOUNTER — Ambulatory Visit (INDEPENDENT_AMBULATORY_CARE_PROVIDER_SITE_OTHER): Payer: 59 | Admitting: Nurse Practitioner

## 2023-02-27 ENCOUNTER — Encounter: Payer: Self-pay | Admitting: Nurse Practitioner

## 2023-02-27 VITALS — BP 126/78 | HR 73 | Temp 98.1°F | Resp 16 | Ht 70.0 in | Wt 157.0 lb

## 2023-02-27 DIAGNOSIS — Z0000 Encounter for general adult medical examination without abnormal findings: Secondary | ICD-10-CM

## 2023-02-27 NOTE — Progress Notes (Signed)
Careteam: Patient Care Team: Sharon Seller, NP as PCP - General (Geriatric Medicine) Karie Soda, MD as Consulting Physician (General Surgery)  PLACE OF SERVICE:  [facility]  Advanced Directive information Does Patient Have a Medical Advance Directive?: No, Does patient want to make changes to medical advance directive?: No - Patient declined  No Known Allergies  Chief Complaint  Patient presents with   Annual Exam    Complete Physical Exam.   Immunizations    Discuss the need for Shingrix vaccine, and Covid Booster. [facility] Verified.      HPI: Patient is a 57 y.o. male annual exam/CPE  Blood work last in October and had colonoscopy in October as well.  He walks during the week and does bike rides with his daughter for exercise.   Tries to eat healthy- more water during the day and trying to eat more fruits.   No tobacco use   Sleep wells at night.    Review of Systems:  Review of Systems  Constitutional:  Negative for chills, fever and weight loss.  HENT:  Negative for tinnitus.   Respiratory:  Negative for cough, sputum production and shortness of breath.   Cardiovascular:  Negative for chest pain, palpitations and leg swelling.  Gastrointestinal:  Negative for abdominal pain, constipation, diarrhea and heartburn.  Genitourinary:  Negative for dysuria, frequency and urgency.  Musculoskeletal:  Negative for back pain, falls, joint pain and myalgias.  Skin: Negative.   Neurological:  Negative for dizziness and headaches.  Psychiatric/Behavioral:  Negative for depression and memory loss. The patient does not have insomnia.     Past Medical History:  Diagnosis Date   Allergy    SEASONAL   Arthritis    right elbow occ. pain   Cataract    hereditary   Headache    occ. none frequent   Past Surgical History:  Procedure Laterality Date   COLONOSCOPY  2014   ELBOW FRACTURE SURGERY Right    surgery to repair and then to debride after infection age 19    HERNIA REPAIR     INGUINAL HERNIA REPAIR Bilateral 08/21/2016   Procedure: LAPAROSCOPIC EXPLORATION AND REPAIR  BILATERAL INGUINAL HERNIA REPAIR;  Surgeon: Karie Soda, MD;  Location: [facility];  Service: General;  Laterality: Bilateral;   INSERTION OF MESH Bilateral 08/21/2016   Procedure: INSERTION OF MESH;  Surgeon: Karie Soda, MD;  Location: [facility];  Service: General;  Laterality: Bilateral;   Social History:   reports that he quit smoking about 11 years ago. His smoking use included cigarettes. He has a 10.00 pack-year smoking history. He has never been exposed to tobacco smoke. He has never used smokeless tobacco. He reports current alcohol use. He reports that he does not use drugs.  Family History  Problem Relation Age of Onset   Arthritis Mother    Diverticulitis Mother    Kidney disease Father    Diabetes Father    Colon cancer Father    Blindness Father    Colon cancer Sister    Diabetes Sister    Arthritis Maternal Grandmother    Heart disease Maternal Grandfather    Hyperlipidemia Maternal Grandfather    Colon cancer Paternal Grandmother    Crohn's disease Neg Hx    Esophageal cancer Neg Hx    Rectal cancer Neg Hx    Stomach cancer Neg Hx    Ulcerative colitis Neg Hx     Medications: Patient's Medications  New Prescriptions   No  medications on file  Previous Medications   ACETAMINOPHEN (TYLENOL) 325 MG TABLET    Take 650 mg by mouth every 6 (six) hours as needed.   ASPIRIN-ACETAMINOPHEN-CAFFEINE (EXCEDRIN PO)    Take by mouth as needed.   FLUTICASONE (FLONASE) 50 MCG/ACT NASAL SPRAY    Place 1 spray into both nostrils See admin instructions. During the Spring & Fall.   IBUPROFEN (ADVIL) 200 MG TABLET    Take 200 mg by mouth as needed for fever, headache, mild pain or moderate pain.   MULTIPLE VITAMINS-MINERALS (MULTIVITAMIN GUMMIES ADULT PO)    Take by mouth.  Modified Medications   No medications on file  Discontinued Medications   No medications on file     Physical Exam:  Vitals:   02/27/23 1442  BP: 126/78  Pulse: 73  Resp: 16  Temp: 98.1 F (36.7 C)  SpO2: 96%  Weight: 157 lb (71.2 kg)  Height: 5\' 10"  (1.778 m)   Body mass index is 22.53 kg/m. Wt Readings from Last 3 Encounters:  02/27/23 157 lb (71.2 kg)  07/11/22 154 lb (69.9 kg)  05/30/22 154 lb (69.9 kg)    Physical Exam Constitutional:      General: He is not in acute distress.    Appearance: He is well-developed. He is not diaphoretic.  HENT:     Head: Normocephalic and atraumatic.     Right Ear: External ear normal.     Left Ear: External ear normal.     Mouth/Throat:     Pharynx: No oropharyngeal exudate.  Eyes:     Conjunctiva/sclera: Conjunctivae normal.     Pupils: Pupils are equal, round, and reactive to light.  Cardiovascular:     Rate and Rhythm: Normal rate and regular rhythm.     Heart sounds: Normal heart sounds.  Pulmonary:     Effort: Pulmonary effort is normal.     Breath sounds: Normal breath sounds.  Abdominal:     General: Bowel sounds are normal.     Palpations: Abdomen is soft.  Musculoskeletal:        General: No tenderness.     Cervical back: Normal range of motion and neck supple.     Right lower leg: No edema.     Left lower leg: No edema.  Skin:    General: Skin is warm and dry.  Neurological:     Mental Status: He is alert and oriented to person, place, and time.    Labs reviewed: Basic Metabolic Panel: Recent Labs    05/19/22 0940  NA 140  K 4.5  CL 105  CO2 25  GLUCOSE 92  BUN 15  CREATININE 1.03  CALCIUM 8.9   Liver Function Tests: Recent Labs    05/19/22 0940  AST 15  ALT 10  BILITOT 0.5  PROT 7.1   No results for input(s): "LIPASE", "AMYLASE" in the last 8760 hours. No results for input(s): "AMMONIA" in the last 8760 hours. CBC: Recent Labs    05/19/22 0940  WBC 6.5  NEUTROABS 4,537  HGB 15.2  HCT 43.7  MCV 88.5  PLT 296   Lipid Panel: Recent Labs    05/19/22 0940  CHOL 199  HDL 64   LDLCALC 115*  TRIG 92  CHOLHDL 3.1   TSH: No results for input(s): "TSH" in the last 8760 hours. A1C: No results found for: "HGBA1C"   Assessment/Plan 1. Routine general medical examination at a health care facility -wellness visit completed today, The patient was  counseled regarding the appropriate use of alcohol, regular self-examination of the breasts on a monthly basis, prevention of dental and periodontal disease, diet, regular sustained exercise for at least 30 minutes 5 times per week,testicular self-examination on a monthly basis,smoking cessation, tobacco use,  and recommended schedule for GI hemoccult testing, colonoscopy, cholesterol, thyroid and diabetes screening. -needs to scheduled dental appt -plans to see eye doctor this year.     Return in 1 year (on 02/27/2024) for CPE with labs at appt .   Janene Harvey. Biagio Borg [facility] [phone]

## 2023-02-27 NOTE — Patient Instructions (Signed)
Health Maintenance, Male Adopting a healthy lifestyle and getting preventive care are important in promoting health and wellness. Ask your health care provider about: The right schedule for you to have regular tests and exams. Things you can do on your own to prevent diseases and keep yourself healthy. What should I know about diet, weight, and exercise? Eat a healthy diet  Eat a diet that includes plenty of vegetables, fruits, low-fat dairy products, and lean protein. Do not eat a lot of foods that are high in solid fats, added sugars, or sodium. Maintain a healthy weight Body mass index (BMI) is a measurement that can be used to identify possible weight problems. It estimates body fat based on height and weight. Your health care provider can help determine your BMI and help you achieve or maintain a healthy weight. Get regular exercise Get regular exercise. This is one of the most important things you can do for your health. Most adults should: Exercise for at least 150 minutes each week. The exercise should increase your heart rate and make you sweat (moderate-intensity exercise). Do strengthening exercises at least twice a week. This is in addition to the moderate-intensity exercise. Spend less time sitting. Even light physical activity can be beneficial. Watch cholesterol and blood lipids Have your blood tested for lipids and cholesterol at 56 years of age, then have this test every 5 years. You may need to have your cholesterol levels checked more often if: Your lipid or cholesterol levels are high. You are older than 56 years of age. You are at high risk for heart disease. What should I know about cancer screening? Many types of cancers can be detected early and may often be prevented. Depending on your health history and family history, you may need to have cancer screening at various ages. This may include screening for: Colorectal cancer. Prostate cancer. Skin cancer. Lung  cancer. What should I know about heart disease, diabetes, and high blood pressure? Blood pressure and heart disease High blood pressure causes heart disease and increases the risk of stroke. This is more likely to develop in people who have high blood pressure readings or are overweight. Talk with your health care provider about your target blood pressure readings. Have your blood pressure checked: Every 3-5 years if you are 18-39 years of age. Every year if you are 40 years old or older. If you are between the ages of 65 and 75 and are a current or former smoker, ask your health care provider if you should have a one-time screening for abdominal aortic aneurysm (AAA). Diabetes Have regular diabetes screenings. This checks your fasting blood sugar level. Have the screening done: Once every three years after age 45 if you are at a normal weight and have a low risk for diabetes. More often and at a younger age if you are overweight or have a high risk for diabetes. What should I know about preventing infection? Hepatitis B If you have a higher risk for hepatitis B, you should be screened for this virus. Talk with your health care provider to find out if you are at risk for hepatitis B infection. Hepatitis C Blood testing is recommended for: Everyone born from 1945 through 1965. Anyone with known risk factors for hepatitis C. Sexually transmitted infections (STIs) You should be screened each year for STIs, including gonorrhea and chlamydia, if: You are sexually active and are younger than 56 years of age. You are older than 56 years of age and your   health care provider tells you that you are at risk for this type of infection. Your sexual activity has changed since you were last screened, and you are at increased risk for chlamydia or gonorrhea. Ask your health care provider if you are at risk. Ask your health care provider about whether you are at high risk for HIV. Your health care provider  may recommend a prescription medicine to help prevent HIV infection. If you choose to take medicine to prevent HIV, you should first get tested for HIV. You should then be tested every 3 months for as long as you are taking the medicine. Follow these instructions at home: Alcohol use Do not drink alcohol if your health care provider tells you not to drink. If you drink alcohol: Limit how much you have to 0-2 drinks a day. Know how much alcohol is in your drink. In the [place] one drink equals one 12 oz bottle of beer (355 mL), one 5 oz glass of wine (148 mL), or one 1 oz glass of hard liquor (44 mL). Lifestyle Do not use any products that contain nicotine or tobacco. These products include cigarettes, chewing tobacco, and vaping devices, such as e-cigarettes. If you need help quitting, ask your health care provider. Do not use street drugs. Do not share needles. Ask your health care provider for help if you need support or information about quitting drugs. General instructions Schedule regular health, dental, and eye exams. Stay current with your vaccines. Tell your health care provider if: You often feel depressed. You have ever been abused or do not feel safe at home. Summary Adopting a healthy lifestyle and getting preventive care are important in promoting health and wellness. Follow your health care provider's instructions about healthy diet, exercising, and getting tested or screened for diseases. Follow your health care provider's instructions on monitoring your cholesterol and blood pressure. This information is not intended to replace advice given to you by your health care provider. Make sure you discuss any questions you have with your health care provider. Document Revised: 01/28/2021 Document Reviewed: 01/28/2021 Elsevier Patient Education  2024 Elsevier Inc.  

## 2023-06-08 ENCOUNTER — Ambulatory Visit
Admission: EM | Admit: 2023-06-08 | Discharge: 2023-06-08 | Disposition: A | Discharge status: Home / Self Care | Payer: 59 | Attending: Emergency Medicine | Admitting: Emergency Medicine

## 2023-06-08 DIAGNOSIS — S90426A Blister (nonthermal), unspecified lesser toe(s), initial encounter: Secondary | ICD-10-CM | POA: Diagnosis not present

## 2023-06-08 DIAGNOSIS — L03031 Cellulitis of right toe: Secondary | ICD-10-CM

## 2023-06-08 MED ORDER — CEPHALEXIN 500 MG PO CAPS: 500.0000 mg | ORAL_CAPSULE | Freq: Four times a day (QID) | ORAL | 0 refills | Status: DC

## 2023-06-08 MED ORDER — MUPIROCIN 2 % EX OINT: 1.0000 | TOPICAL_OINTMENT | Freq: Two times a day (BID) | CUTANEOUS | 0 refills | Status: DC

## 2023-06-08 NOTE — Discharge Instructions (Signed)
Take antibiotics as prescribed. Recommend soak the area and apply antibiotic ointment as prescribed to the blister. Try to avoid tight shoes or other pressure to the area. Follow-up with PCP or podiatrist if no improvement or worsening even with antibiotics.

## 2023-06-08 NOTE — ED Provider Notes (Signed)
[facility]    CSN: [id number] Arrival date & time: 06/08/23  1038      History   Chief Complaint No chief complaint on file.   HPI Dennis Morris is a 57 y.o. male.   Patient presents with concerns of painful spot on his right middle toe. He noticed it a few days ago, when it appeared like a small pimple. He states it has gotten larger, tripling in size yesterday. It is painful to touch, but not painful at rest. He denies any itching. He applied neosporin yesterday and reports it started draining today when he put a sock on. The patient denies prior similar. He states he has issues with athlete's foot and had been using spray for that, but states he hasn't had a spot like this with past athlete's foot. He denies fever or feeling unwell otherwise.  The history is provided by the patient.    Past Medical History:  Diagnosis Date   Allergy    SEASONAL   Arthritis    right elbow occ. pain   Cataract    hereditary   Headache    occ. none frequent    Patient Active Problem List   Diagnosis Date Noted   Bilateral inguinal hernias s/p lap repair with mesh 08/21/2016 08/21/2016    Past Surgical History:  Procedure Laterality Date   COLONOSCOPY  2014   ELBOW FRACTURE SURGERY Right    surgery to repair and then to debride after infection age 45   HERNIA REPAIR     INGUINAL HERNIA REPAIR Bilateral 08/21/2016   Procedure: LAPAROSCOPIC EXPLORATION AND REPAIR  BILATERAL INGUINAL HERNIA REPAIR;  Surgeon: Karie Soda, MD;  Location: [facility];  Service: General;  Laterality: Bilateral;   INSERTION OF MESH Bilateral 08/21/2016   Procedure: INSERTION OF MESH;  Surgeon: Karie Soda, MD;  Location: [facility];  Service: General;  Laterality: Bilateral;       Home Medications    Prior to Admission medications   Medication Sig Start Date End Date Taking? Authorizing Provider  cephALEXin (KEFLEX) 500 MG capsule Take 1 capsule (500 mg total) by mouth 4 (four) times daily. 06/08/23   Yes Tallon Gertz L, PA  mupirocin ointment (BACTROBAN) 2 % Apply 1 Application topically 2 (two) times daily. 06/08/23  Yes Dessire Grimes L, PA  acetaminophen (TYLENOL) 325 MG tablet Take 650 mg by mouth every 6 (six) hours as needed.    [provider]  Aspirin-Acetaminophen-Caffeine (EXCEDRIN PO) Take by mouth as needed.    [provider]  fluticasone (FLONASE) 50 MCG/ACT nasal spray Place 1 spray into both nostrils See admin instructions. During the Spring & Fall.    [provider]  ibuprofen (ADVIL) 200 MG tablet Take 200 mg by mouth as needed for fever, headache, mild pain or moderate pain.    [provider]  Multiple Vitamins-Minerals (MULTIVITAMIN GUMMIES ADULT PO) Take by mouth.    [provider]    Family History Family History  Problem Relation Age of Onset   Arthritis Mother    Diverticulitis Mother    Kidney disease Father    Diabetes Father    Colon cancer Father    Blindness Father    Colon cancer Sister    Diabetes Sister    Arthritis Maternal Grandmother    Heart disease Maternal Grandfather    Hyperlipidemia Maternal Grandfather    Colon cancer Paternal Grandmother    Crohn's disease Neg Hx    Esophageal cancer Neg  Hx    Rectal cancer Neg Hx    Stomach cancer Neg Hx    Ulcerative colitis Neg Hx     Social History Social History   Tobacco Use   Smoking status: Former    Current packs/day: 0.00    Average packs/day: 0.5 packs/day for 20.0 years (10.0 ttl pk-yrs)    Types: Cigarettes    Start date: 08/19/1991    Quit date: 08/19/2011    Years since quitting: 11.8    Passive exposure: Never   Smokeless tobacco: Never  Vaping Use   Vaping status: Never Used  Substance Use Topics   Alcohol use: Yes    Comment: rare -social   Drug use: No     Allergies   Patient has no known allergies.   Review of Systems Review of Systems  Constitutional:  Negative for fever.  Musculoskeletal:  Negative for arthralgias  and gait problem.  Skin:  Positive for color change and wound.  Neurological:  Negative for numbness.     Physical Exam Triage Vital Signs ED Triage Vitals  Encounter Vitals Group     BP 06/08/23 1052 121/81     Systolic BP Percentile --      Diastolic BP Percentile --      Pulse Rate 06/08/23 1052 83     Resp 06/08/23 1052 16     Temp 06/08/23 1052 98.2 F (36.8 C)     Temp Source 06/08/23 1052 Oral     SpO2 06/08/23 1052 95 %     Weight --      Height --      Head Circumference --      Peak Flow --      Pain Score 06/08/23 1055 0     Pain Loc --      Pain Education --      Exclude from Growth Chart --    No data found.  Updated Vital Signs BP 121/81 (BP Location: Right Arm)   Pulse 83   Temp 98.2 F (36.8 C) (Oral)   Resp 16   SpO2 95%   Visual Acuity Right Eye Distance:   Left Eye Distance:   Bilateral Distance:    Right Eye Near:   Left Eye Near:    Bilateral Near:     Physical Exam Vitals and nursing note reviewed.  Constitutional:      General: He is not in acute distress. Cardiovascular:     Pulses: Normal pulses.  Pulmonary:     Effort: Pulmonary effort is normal.  Skin:    Comments: Approx 1cm diameter blister to dorsal proximal 3rd right toe. Surrounding warmth and erythema. Blister drains purulent blood-tinged fluid with light pressure applied. Tender. No tenderness or erythema extending into foot or plantar aspect of toe.  Neurological:     Mental Status: He is alert.     Gait: Gait normal.  Psychiatric:        Mood and Affect: Mood normal.      UC Treatments / Results  Labs (all labs ordered are listed, but only abnormal results are displayed) Labs Reviewed - No data to display  EKG   Radiology No results found.  Procedures Procedures (including critical care time)  Medications Ordered in UC Medications - No data to display  Initial Impression / Assessment and Plan / UC Course  I have reviewed the triage vital signs and  the nursing notes.  Pertinent labs & imaging results that were available during my  care of the patient were reviewed by me and considered in my medical decision making (see chart for details).     Evidence of cellulitis with localized erythema, warmth, and tenderness - empiric abx prescribed. Given purulent nature of blister, will also prescribe topical antibiotics for patient to apply. Discussed wound care and monitoring with return precautions.   E/M: 1 acute uncomplicated illness, no data, moderate risk due to prescription management  Final Clinical Impressions(s) / UC Diagnoses   Final diagnoses:  Cellulitis of toe of right foot  Blister of third toe     Discharge Instructions      Take antibiotics as prescribed. Recommend soak the area and apply antibiotic ointment as prescribed to the blister. Try to avoid tight shoes or other pressure to the area. Follow-up with PCP or podiatrist if no improvement or worsening even with antibiotics.     ED Prescriptions     Medication Sig Dispense Auth. Provider   cephALEXin (KEFLEX) 500 MG capsule Take 1 capsule (500 mg total) by mouth 4 (four) times daily. 20 capsule Vallery Sa, Briyah Wheelwright L, PA   mupirocin ointment (BACTROBAN) 2 % Apply 1 Application topically 2 (two) times daily. 22 g Vallery Sa, Mate Alegria L, [place]      PDMP not reviewed this encounter.   Estanislado Pandy, [place] 06/08/23 1119

## 2023-06-08 NOTE — ED Triage Notes (Signed)
Pt presents to UC w/ c/o wound on middle toe of right foot x3 days. Pt states it started draining yesterday. He has tried neosporin without relief. Pt reports recently using medication for athlete's foot.

## 2024-02-16 ENCOUNTER — Ambulatory Visit
Admission: RE | Admit: 2024-02-16 | Discharge: 2024-02-16 | Disposition: A | Discharge status: Home / Self Care | Source: Ambulatory Visit | Attending: Nurse Practitioner | Admitting: Nurse Practitioner

## 2024-02-16 ENCOUNTER — Other Ambulatory Visit: Payer: Self-pay

## 2024-02-16 VITALS — BP 137/86 | HR 63 | Temp 98.1°F | Resp 16

## 2024-02-16 DIAGNOSIS — K130 Diseases of lips: Secondary | ICD-10-CM | POA: Diagnosis not present

## 2024-02-16 MED ORDER — MUPIROCIN 2 % EX OINT: 1.0000 | TOPICAL_OINTMENT | Freq: Two times a day (BID) | CUTANEOUS | 0 refills | Status: AC

## 2024-02-16 MED ORDER — SULFAMETHOXAZOLE-TRIMETHOPRIM 800-160 MG PO TABS: 1.0000 | ORAL_TABLET | Freq: Two times a day (BID) | ORAL | 0 refills | Status: AC

## 2024-02-16 NOTE — ED Triage Notes (Signed)
 Pt c/o right lower lip swelling. Pt states noticed a bump on Sunday when he was shaving. Pt states it's been getting more painful and swelling more. Pt has 1+ right lower lip swelling

## 2024-02-16 NOTE — ED Provider Notes (Signed)
 [facility]    CSN: [id number] Arrival date & time: 02/16/24  0865      History   Chief Complaint Chief Complaint  Patient presents with   Oral Swelling    Lower lip swelling and pain - Entered by patient    HPI Dennis Morris is a 58 y.o. male.   Dennis Morris is a 58 y.o. male with lower lip swelling that began on Sunday. The patient initially noticed a small swelling while shaving on Sunday, which he thought might be a pimple. The swelling progressed and became painful on Monday. During the night, it drained a small amount of blood. The swelling worsened overnight, and the patient woke up with significant swelling of the lower lip. The patient reports that the swelling was more pronounced this morning but has slightly improved after taking ibuprofen. He describes the affected area as looking like a small hole, rather than a typical cold sore which would be more scabby. The patient admits to manipulating the area slightly last night, which may have contributed to the drainage. The swelling is localized to the lower lip, with no involvement of the tongue, throat, or inside of the mouth. The patient denies any sore throat or difficulty swallowing. Associated symptoms include a mild headache today. No significant fever. There is no reported pain in the teeth or presence of sores inside the mouth. The patient has not experienced any neck pain.  The following portions of the patient's history were reviewed and updated as appropriate: allergies, current medications, past family history, past medical history, past social history, past surgical history, and problem list.    Past Medical History:  Diagnosis Date   Allergy    SEASONAL   Arthritis    right elbow occ. pain   Cataract    hereditary   Headache    occ. none frequent    Patient Active Problem List   Diagnosis Date Noted   Bilateral inguinal hernias s/p lap repair with mesh 08/21/2016 08/21/2016    Past Surgical  History:  Procedure Laterality Date   COLONOSCOPY  2014   ELBOW FRACTURE SURGERY Right    surgery to repair and then to debride after infection age 11   HERNIA REPAIR     INGUINAL HERNIA REPAIR Bilateral 08/21/2016   Procedure: LAPAROSCOPIC EXPLORATION AND REPAIR  BILATERAL INGUINAL HERNIA REPAIR;  Surgeon: Candyce Champagne, MD;  Location: [facility];  Service: General;  Laterality: Bilateral;   INSERTION OF MESH Bilateral 08/21/2016   Procedure: INSERTION OF MESH;  Surgeon: Candyce Champagne, MD;  Location: [facility];  Service: General;  Laterality: Bilateral;       Home Medications    Prior to Admission medications   Medication Sig Start Date End Date Taking? Authorizing Provider  mupirocin  ointment (BACTROBAN ) 2 % Apply 1 Application topically 2 (two) times daily. Place thin layer of ointment to affected area twice a day for 1 week 02/16/24  Yes Maryruth Sol, FNP  sulfamethoxazole-trimethoprim (BACTRIM DS) 800-160 MG tablet Take 1 tablet by mouth 2 (two) times daily for 7 days. 02/16/24 02/23/24 Yes Maryruth Sol, FNP  acetaminophen  (TYLENOL ) 325 MG tablet Take 650 mg by mouth every 6 (six) hours as needed.    [provider]  Aspirin-Acetaminophen -Caffeine (EXCEDRIN PO) Take by mouth as needed.    [provider]  fluticasone (FLONASE) 50 MCG/ACT nasal spray Place 1 spray into both nostrils See admin instructions. During the Spring & Fall.    [provider]  ibuprofen (  ADVIL) 200 MG tablet Take 200 mg by mouth as needed for fever, headache, mild pain or moderate pain.    [provider]  Multiple Vitamins-Minerals (MULTIVITAMIN GUMMIES ADULT PO) Take by mouth.    [provider]    Family History Family History  Problem Relation Age of Onset   Arthritis Mother    Diverticulitis Mother    Kidney disease Father    Diabetes Father    Colon cancer Father    Blindness Father    Colon cancer Sister    Diabetes Sister    Arthritis Maternal  Grandmother    Heart disease Maternal Grandfather    Hyperlipidemia Maternal Grandfather    Colon cancer Paternal Grandmother    Crohn's disease Neg Hx    Esophageal cancer Neg Hx    Rectal cancer Neg Hx    Stomach cancer Neg Hx    Ulcerative colitis Neg Hx     Social History Social History   Tobacco Use   Smoking status: Former    Current packs/day: 0.00    Average packs/day: 0.5 packs/day for 20.0 years (10.0 ttl pk-yrs)    Types: Cigarettes    Start date: 08/19/1991    Quit date: 08/19/2011    Years since quitting: 12.5    Passive exposure: Never   Smokeless tobacco: Never  Vaping Use   Vaping status: Never Used  Substance Use Topics   Alcohol use: Yes    Comment: rare -social   Drug use: No     Allergies   Patient has no known allergies.   Review of Systems Review of Systems  Constitutional:  Negative for fever.  HENT:  Negative for dental problem, sore throat and trouble swallowing.        Lower lip swelling   Musculoskeletal:  Negative for neck pain.  Neurological:  Positive for headaches (mild; today).  All other systems reviewed and are negative.    Physical Exam Triage Vital Signs ED Triage Vitals  Encounter Vitals Group     BP 02/16/24 1028 137/86     Systolic BP Percentile --      Diastolic BP Percentile --      Pulse Rate 02/16/24 1028 63     Resp 02/16/24 1028 16     Temp 02/16/24 1028 98.1 F (36.7 C)     Temp Source 02/16/24 1028 Oral     SpO2 02/16/24 1028 98 %     Weight --      Height --      Head Circumference --      Peak Flow --      Pain Score 02/16/24 1027 5     Pain Loc --      Pain Education --      Exclude from Growth Chart --    No data found.  Updated Vital Signs BP 137/86   Pulse 63   Temp 98.1 F (36.7 C) (Oral)   Resp 16   SpO2 98%   Visual Acuity Right Eye Distance:   Left Eye Distance:   Bilateral Distance:    Right Eye Near:   Left Eye Near:    Bilateral Near:     Physical Exam Vitals reviewed.   Constitutional:      General: He is not in acute distress.    Appearance: Normal appearance. He is not toxic-appearing.  HENT:     Head: Normocephalic.     Mouth/Throat:     Lips: Pink.  Mouth: Mucous membranes are moist.     Dentition: Normal dentition. No gingival swelling.     Tongue: No lesions.     Palate: No lesions.     Pharynx: Oropharynx is clear. Uvula midline.      Comments: Lesion noted on the right lower lip appears scabbed with surrounding erythema and localized swelling (see picture below). There is some fluctuance directly under the lesion.  No lesions, sores, or abnormalities are seen on the inner lip or within the oral cavity. Dentition is normal and intact, with no signs of dental infection or involvement.  Eyes:     Conjunctiva/sclera: Conjunctivae normal.  Cardiovascular:     Rate and Rhythm: Normal rate and regular rhythm.     Heart sounds: Normal heart sounds.  Pulmonary:     Effort: Pulmonary effort is normal.     Breath sounds: Normal breath sounds.  Musculoskeletal:        General: Normal range of motion.  Skin:    General: Skin is warm and dry.  Neurological:     General: No focal deficit present.     Mental Status: He is alert and oriented to person, place, and time.      UC Treatments / Results  Labs (all labs ordered are listed, but only abnormal results are displayed) Labs Reviewed  AEROBIC CULTURE W GRAM STAIN (SUPERFICIAL SPECIMEN)    EKG   Radiology No results found.  Procedures Incision and Drainage  Date/Time: 02/16/2024 11:12 AM  Performed by: Maryruth Sol, FNP Authorized by: Maryruth Sol, FNP   Consent:    Consent obtained:  Verbal   Consent given by:  Patient   Risks, benefits, and alternatives were discussed: yes     Risks discussed:  Bleeding and pain Universal protocol:    Patient identity confirmed:  Verbally with patient and arm band Location:    Type:  Abscess   Location:  Head   Head/neck  location: right lower lip. Pre-procedure details:    Skin preparation:  Chlorhexidine  with alcohol Anesthesia:    Anesthesia method:  Local infiltration   Local anesthetic:  Lidocaine  2% WITH epi Procedure type:    Complexity:  Simple Procedure details:    Incision types:  Single straight   Drainage:  Purulent   Drainage amount:  Scant   Wound treatment:  Wound left open Post-procedure details:    Procedure completion:  Tolerated well, no immediate complications  (including critical care time)  Medications Ordered in UC Medications - No data to display  Initial Impression / Assessment and Plan / UC Course  I have reviewed the triage vital signs and the nursing notes.  Pertinent labs & imaging results that were available during my care of the patient were reviewed by me and considered in my medical decision making (see chart for details).    Patient presents with swelling of the lower lip that began on Sunday while shaving, initially appearing as a pimple and progressively becoming more painful by the following day. The area began to drain slightly last night, followed by a significant increase in swelling this morning. Patient has taken ibuprofen with some relief and denies systemic symptoms aside from a mild headache. On exam, swelling is localized to the lower lip with no involvement of the tongue, oral cavity, or throat. Clinical findings suggest a localized infection or inflammatory process. A needle aspiration was performed under local anesthesia, yielding a scant amount of purulent material, though swelling decreased significantly post-procedure. Culture was  obtained. The patient was prescribed mupirocin  ointment and Bactrim DS, both to be taken twice daily for seven days. Warm moist compresses were recommended, and the patient was advised not to manipulate the area. Education was provided regarding signs of worsening infection and the need for follow-up if symptoms  progress.  Today's evaluation has revealed no signs of a dangerous process. Discussed diagnosis with patient and/or guardian. Patient and/or guardian aware of their diagnosis, possible red flag symptoms to watch out for and need for close follow up. Patient and/or guardian understands verbal and written discharge instructions. Patient and/or guardian comfortable with plan and disposition.  Patient and/or guardian has a clear mental status at this time, good insight into illness (after discussion and teaching) and has clear judgment to make decisions regarding their care  Documentation was completed with the aid of voice recognition software. Transcription may contain typographical errors.  Final Clinical Impressions(s) / UC Diagnoses   Final diagnoses:  Lip abscess     Discharge Instructions      You were seen today for a painful swelling on your lower lip that began a few days ago and has since progressed. Based on your symptoms and the exam, you were found to have a localized abscess on the lower lip. This likely started as a small infection and developed into a collection of pus beneath the skin.  A needle aspiration was performed today using a local anesthetic to help numb the area. A small amount of pus was drained during the procedure, and a sample was sent to the lab for culture to help identify the cause of the infection. Although only a small amount of drainage was obtained, the swelling decreased noticeably after the procedure which is good.   You have been prescribed an antibiotic called Bactrim DS, which you should take twice daily for seven days. Be sure to take it exactly as directed and complete the full course, even if the area starts to feel better. You were also given a topical antibiotic, mupirocin  ointment, which should be applied to the affected area twice a day for seven days to help reduce bacteria on the skin and prevent further infection.  To aid healing, apply warm,  moist compresses to your lower lip several times a day. This can help encourage further drainage and reduce discomfort and swelling. Do not squeeze, pick at, or manipulate the area, as this can worsen the infection and slow healing.  It is important to monitor the area closely. If you notice any worsening swelling, increased redness, pain, fever, spreading of the infection, or difficulty eating or speaking, please seek medical attention. You will be contacted if the culture results suggest a different treatment is needed.  If you have any questions or concerns, or if your symptoms do not improve within a few days, follow up with your healthcare provider.    ED Prescriptions     Medication Sig Dispense Auth. Provider   sulfamethoxazole-trimethoprim (BACTRIM DS) 800-160 MG tablet Take 1 tablet by mouth 2 (two) times daily for 7 days. 14 tablet Braylie Badami, FNP   mupirocin  ointment (BACTROBAN ) 2 % Apply 1 Application topically 2 (two) times daily. Place thin layer of ointment to affected area twice a day for 1 week 15 g Maryruth Sol, FNP      PDMP not reviewed this encounter.   Maryruth Sol, [place] 02/16/24 1118

## 2024-02-16 NOTE — Discharge Instructions (Addendum)
 You were seen today for a painful swelling on your lower lip that began a few days ago and has since progressed. Based on your symptoms and the exam, you were found to have a localized abscess on the lower lip. This likely started as a small infection and developed into a collection of pus beneath the skin.  A needle aspiration was performed today using a local anesthetic to help numb the area. A small amount of pus was drained during the procedure, and a sample was sent to the lab for culture to help identify the cause of the infection. Although only a small amount of drainage was obtained, the swelling decreased noticeably after the procedure which is good.   You have been prescribed an antibiotic called Bactrim DS, which you should take twice daily for seven days. Be sure to take it exactly as directed and complete the full course, even if the area starts to feel better. You were also given a topical antibiotic, mupirocin  ointment, which should be applied to the affected area twice a day for seven days to help reduce bacteria on the skin and prevent further infection.  To aid healing, apply warm, moist compresses to your lower lip several times a day. This can help encourage further drainage and reduce discomfort and swelling. Do not squeeze, pick at, or manipulate the area, as this can worsen the infection and slow healing.  It is important to monitor the area closely. If you notice any worsening swelling, increased redness, pain, fever, spreading of the infection, or difficulty eating or speaking, please seek medical attention. You will be contacted if the culture results suggest a different treatment is needed.  If you have any questions or concerns, or if your symptoms do not improve within a few days, follow up with your healthcare provider.

## 2024-02-18 ENCOUNTER — Ambulatory Visit: Payer: Self-pay | Admitting: Nurse Practitioner

## 2024-02-19 ENCOUNTER — Ambulatory Visit
Admission: RE | Admit: 2024-02-19 | Discharge: 2024-02-19 | Disposition: A | Discharge status: Home / Self Care | Source: Ambulatory Visit | Attending: Family Medicine | Admitting: Family Medicine

## 2024-02-19 ENCOUNTER — Emergency Department (HOSPITAL_BASED_OUTPATIENT_CLINIC_OR_DEPARTMENT_OTHER)
Admission: EM | Admit: 2024-02-19 | Discharge: 2024-02-19 | Disposition: A | Discharge status: Home / Self Care | Attending: Emergency Medicine | Admitting: Emergency Medicine

## 2024-02-19 ENCOUNTER — Other Ambulatory Visit: Payer: Self-pay

## 2024-02-19 ENCOUNTER — Encounter (HOSPITAL_BASED_OUTPATIENT_CLINIC_OR_DEPARTMENT_OTHER): Payer: Self-pay

## 2024-02-19 VITALS — BP 122/89 | HR 62 | Temp 97.8°F | Resp 16

## 2024-02-19 DIAGNOSIS — R220 Localized swelling, mass and lump, head: Secondary | ICD-10-CM | POA: Diagnosis not present

## 2024-02-19 DIAGNOSIS — Z7982 Long term (current) use of aspirin: Secondary | ICD-10-CM | POA: Insufficient documentation

## 2024-02-19 DIAGNOSIS — K130 Diseases of lips: Secondary | ICD-10-CM | POA: Diagnosis not present

## 2024-02-19 LAB — LACTIC ACID, PLASMA: Lactic Acid, Venous: 0.7 mmol/L (ref 0.5–1.9)

## 2024-02-19 LAB — CBC WITH DIFFERENTIAL/PLATELET
Abs Immature Granulocytes: 0.03 10*3/uL (ref 0.00–0.07)
Basophils Absolute: 0 10*3/uL (ref 0.0–0.1)
Basophils Relative: 1 %
Eosinophils Absolute: 0.1 10*3/uL (ref 0.0–0.5)
Eosinophils Relative: 2 %
HCT: 41.3 % (ref 39.0–52.0)
Hemoglobin: 13.8 g/dL (ref 13.0–17.0)
Immature Granulocytes: 0 %
Lymphocytes Relative: 20 %
Lymphs Abs: 1.6 10*3/uL (ref 0.7–4.0)
MCH: 30.1 pg (ref 26.0–34.0)
MCHC: 33.4 g/dL (ref 30.0–36.0)
MCV: 90 fL (ref 80.0–100.0)
Monocytes Absolute: 0.7 10*3/uL (ref 0.1–1.0)
Monocytes Relative: 9 %
Neutro Abs: 5.8 10*3/uL (ref 1.7–7.7)
Neutrophils Relative %: 68 %
Platelets: 312 10*3/uL (ref 150–400)
RBC: 4.59 MIL/uL (ref 4.22–5.81)
RDW: 12.9 % (ref 11.5–15.5)
WBC: 8.3 10*3/uL (ref 4.0–10.5)
nRBC: 0 % (ref 0.0–0.2)

## 2024-02-19 LAB — AEROBIC CULTURE W GRAM STAIN (SUPERFICIAL SPECIMEN)
Gram Stain: NONE SEEN
Special Requests: NORMAL

## 2024-02-19 LAB — COMPREHENSIVE METABOLIC PANEL WITH GFR
ALT: 21 U/L (ref 0–44)
AST: 22 U/L (ref 15–41)
Albumin: 4.5 g/dL (ref 3.5–5.0)
Alkaline Phosphatase: 67 U/L (ref 38–126)
Anion gap: 13 (ref 5–15)
BUN: 19 mg/dL (ref 6–20)
CO2: 23 mmol/L (ref 22–32)
Calcium: 9.3 mg/dL (ref 8.9–10.3)
Chloride: 103 mmol/L (ref 98–111)
Creatinine, Ser: 1.03 mg/dL (ref 0.61–1.24)
GFR, Estimated: >60 mL/min (ref >60)
Glucose, Bld: 76 mg/dL (ref 70–99)
Potassium: 4.4 mmol/L (ref 3.5–5.1)
Sodium: 138 mmol/L (ref 135–145)
Total Bilirubin: 0.3 mg/dL (ref 0.0–1.2)
Total Protein: 7.5 g/dL (ref 6.5–8.1)

## 2024-02-19 MED ORDER — CLINDAMYCIN HCL 150 MG PO CAPS
300.0000 mg | ORAL_CAPSULE | Freq: Once | ORAL | Status: AC
  Administered 2024-02-19: 300 mg via ORAL
  Filled 2024-02-19: qty 2

## 2024-02-19 MED ORDER — DEXAMETHASONE SODIUM PHOSPHATE 10 MG/ML IJ SOLN
10.0000 mg | Freq: Once | INTRAMUSCULAR | Status: AC
  Administered 2024-02-19: 10 mg via INTRAMUSCULAR

## 2024-02-19 MED ORDER — CLINDAMYCIN HCL 150 MG PO CAPS: 150.0000 mg | ORAL_CAPSULE | Freq: Four times a day (QID) | ORAL | 0 refills | Status: AC

## 2024-02-19 MED ORDER — PREDNISONE 10 MG PO TABS: 10.0000 mg | ORAL_TABLET | Freq: Every day | ORAL | 0 refills | Status: AC

## 2024-02-19 MED ORDER — DEXAMETHASONE SODIUM PHOSPHATE 10 MG/ML IJ SOLN
10.0000 mg | Freq: Once | INTRAMUSCULAR | Status: DC
  Filled 2024-02-19: qty 1

## 2024-02-19 NOTE — ED Triage Notes (Signed)
 Seen at Urgent care on Tuesday for swollen lip.  Started on antibiotics.  States not getting better and sent here for eval.  Noted bottom lip swollen with drainage.

## 2024-02-19 NOTE — Discharge Instructions (Signed)
 Please go to the ER for further evaluation of your abscess

## 2024-02-19 NOTE — Discharge Instructions (Signed)
 You have been seen and discharged from the emergency department.  We got the recommendation from the on-call ear nose and throat doctor.  Discontinue the Bactrim.  Take the clindamycin prescription as prescribed.  Take prednisone taper as directed.  Do warm compresses and take over-the-counter medication for pain control.  You may take 6 to 800 mg of ibuprofen every 6-8 hours.  If you have no improvement or other concerns on Monday please call the ENT office for outpatient follow-up.  Follow-up with your primary provider for further evaluation and further care. Take home medications as prescribed. If you have any worsening symptoms or further concerns for your health please return to an emergency department for further evaluation.

## 2024-02-19 NOTE — ED Triage Notes (Signed)
 Pt presents to uc with co right bottom lip swelling since Monday. Pt reports he was unsure if he nicked it while shaving or if it was a cold sore. Pt has been taking antibiotic with no improvement.

## 2024-02-19 NOTE — ED Provider Notes (Signed)
 [place] EMERGENCY DEPARTMENT AT [facility] Provider Note   CSN: [id number] Arrival date & time: 02/19/24  1645     History  Chief Complaint  Patient presents with   Oral Swelling    Doren Gander is a 58 y.o. male.  HPI   58 year old male presents emergency department with concern for lip swelling.  About 5 days ago patient had a bump on the outside right lower lip along the vermilion border.  He nicked it while shaving.  He states that it became red, swollen.  He was seen in urgent care 3 days ago.  They performed incision and drainage, sent a wound culture, put him on oral and topical antibiotics.  Since then the lip has continued to swell.  Pain is moderate.  Denies any fever or systemic symptoms.  No oral or neck swelling, no difficulty swallowing.  Home Medications Prior to Admission medications   Medication Sig Start Date End Date Taking? Authorizing Provider  acetaminophen  (TYLENOL ) 325 MG tablet Take 650 mg by mouth every 6 (six) hours as needed.    [provider]  Aspirin-Acetaminophen -Caffeine (EXCEDRIN PO) Take by mouth as needed.    [provider]  fluticasone (FLONASE) 50 MCG/ACT nasal spray Place 1 spray into both nostrils See admin instructions. During the Spring & Fall.    [provider]  ibuprofen (ADVIL) 200 MG tablet Take 200 mg by mouth as needed for fever, headache, mild pain or moderate pain.    [provider]  Multiple Vitamins-Minerals (MULTIVITAMIN GUMMIES ADULT PO) Take by mouth.    [provider]  mupirocin  ointment (BACTROBAN ) 2 % Apply 1 Application topically 2 (two) times daily. Place thin layer of ointment to affected area twice a day for 1 week 02/16/24   Maryruth Sol, FNP  sulfamethoxazole-trimethoprim (BACTRIM DS) 800-160 MG tablet Take 1 tablet by mouth 2 (two) times daily for 7 days. 02/16/24 02/23/24  Murrill, Samantha, FNP      Allergies    Patient has no known allergies.    Review  of Systems   Review of Systems  Constitutional:  Negative for fever.  HENT:  Positive for facial swelling. Negative for dental problem, drooling, trouble swallowing and voice change.   Respiratory:  Negative for shortness of breath.   Musculoskeletal:  Negative for neck pain and neck stiffness.    Physical Exam Updated Vital Signs BP 137/87   Pulse 68   Temp 97.7 F (36.5 C)   Resp 15   Ht 5\' 10"  (1.778 m)   Wt 70.3 kg   SpO2 100%   BMI 22.24 kg/m  Physical Exam Vitals and nursing note reviewed.  Constitutional:      Appearance: Normal appearance.  HENT:     Head: Normocephalic.     Mouth/Throat:     Mouth: Mucous membranes are moist.     Comments: Lower lip is swollen, erythematous, indurated, with an area that looks like some granulation tissue.  No extension intraorally to the gum, cheek or submandibular area.  No stridor or neck swelling. Cardiovascular:     Rate and Rhythm: Normal rate.  Pulmonary:     Effort: Pulmonary effort is normal. No respiratory distress.  Abdominal:     Palpations: Abdomen is soft.     Tenderness: There is no abdominal tenderness.  Skin:    General: Skin is warm.  Neurological:     Mental Status: He is alert and oriented to person, place, and time. Mental status is  at baseline.  Psychiatric:        Mood and Affect: Mood normal.     ED Results / Procedures / Treatments   Labs (all labs ordered are listed, but only abnormal results are displayed) Labs Reviewed  LACTIC ACID, PLASMA  COMPREHENSIVE METABOLIC PANEL WITH GFR  CBC WITH DIFFERENTIAL/PLATELET  LACTIC ACID, PLASMA    EKG None  Radiology No results found.  Procedures Procedures    Medications Ordered in ED Medications  dexamethasone  (DECADRON ) injection 10 mg (10 mg Intravenous Not Given 02/19/24 2110)  clindamycin (CLEOCIN) capsule 300 mg (300 mg Oral Given 02/19/24 2107)  dexamethasone  (DECADRON ) injection 10 mg (10 mg Intramuscular Given 02/19/24 2109)    ED  Course/ Medical Decision Making/ A&P                                 Medical Decision Making Amount and/or Complexity of Data Reviewed Labs: ordered.  Risk Prescription drug management.   58 year old male presents emergency department with worsening lip swelling status post I&D from urgent care, on oral and topical antibiotics.  No fever or systemic symptoms.  Vitals are stable on arrival.  Lip swelling has progressed.  There is a small area of granulation tissue and serous drainage, otherwise the lip is swollen, red, indurated.  No intraoral findings or submandibular swelling/neck swelling.  Please refer to media pictures.  Blood work is reassuring, no white count.  No findings of sepsis.  Consulted with Dr. Westley Hammers.  She recommends rewound culture, a dose of IM Decadron  and escalating antibiotics from Bactrim to Cleocin as well as continuing a prednisone taper.  We do not believe there is benefit of reI&D at this time.  There is no significant fluctuance that I can identify.  Also advised for warm compresses.  The above therapies have been discussed with the patient.  He understands and agrees.  Dose of medication given here in the department.  If he has not improved over the weekend he will call the office Monday for outpatient follow-up with ENT.  Patient at this time appears safe and stable for discharge and close outpatient follow up. Discharge plan and strict return to ED precautions discussed, patient verbalizes understanding and agreement.        Final Clinical Impression(s) / ED Diagnoses Final diagnoses:  None    Rx / DC Orders ED Discharge Orders     None         Flonnie Humphrey, DO 02/19/24 2131

## 2024-02-19 NOTE — ED Provider Notes (Signed)
 [facility]    CSN: [id number] Arrival date & time: 02/19/24  1550      History   Chief Complaint Chief Complaint  Patient presents with   Oral Swelling    Came to have my lower lip checked due to swelling and pain. I've been taking antibiotics and using the cream but my lip seems to be getting worse - Entered by patient    HPI Dennis Morris is a 58 y.o. male presents for lip abscess.  Patient was seen in urgent care on 5/27 for a right lower lip cyst.  I&D was performed with wound culture taken.  He was started on Bactrim and mupirocin .  Wound culture results show sensitivity to Bactrim.  He states his symptoms have not improved and have worsened.  He states the swelling and pain have worsened.  Denies any fevers or facial swelling.  Does state he feels like it is beginning to swell on the inside of his cheek as well.  He has been taking the Bactrim and mupirocin  as prescribed.    HPI  Past Medical History:  Diagnosis Date   Allergy    SEASONAL   Arthritis    right elbow occ. pain   Cataract    hereditary   Headache    occ. none frequent    Patient Active Problem List   Diagnosis Date Noted   Bilateral inguinal hernias s/p lap repair with mesh 08/21/2016 08/21/2016    Past Surgical History:  Procedure Laterality Date   COLONOSCOPY  2014   ELBOW FRACTURE SURGERY Right    surgery to repair and then to debride after infection age 79   HERNIA REPAIR     INGUINAL HERNIA REPAIR Bilateral 08/21/2016   Procedure: LAPAROSCOPIC EXPLORATION AND REPAIR  BILATERAL INGUINAL HERNIA REPAIR;  Surgeon: Candyce Champagne, MD;  Location: [facility];  Service: General;  Laterality: Bilateral;   INSERTION OF MESH Bilateral 08/21/2016   Procedure: INSERTION OF MESH;  Surgeon: Candyce Champagne, MD;  Location: [facility];  Service: General;  Laterality: Bilateral;       Home Medications    Prior to Admission medications   Medication Sig Start Date End Date Taking? Authorizing Provider   acetaminophen  (TYLENOL ) 325 MG tablet Take 650 mg by mouth every 6 (six) hours as needed.    [provider]  Aspirin-Acetaminophen -Caffeine (EXCEDRIN PO) Take by mouth as needed.    [provider]  fluticasone (FLONASE) 50 MCG/ACT nasal spray Place 1 spray into both nostrils See admin instructions. During the Spring & Fall.    [provider]  ibuprofen (ADVIL) 200 MG tablet Take 200 mg by mouth as needed for fever, headache, mild pain or moderate pain.    [provider]  Multiple Vitamins-Minerals (MULTIVITAMIN GUMMIES ADULT PO) Take by mouth.    [provider]  mupirocin  ointment (BACTROBAN ) 2 % Apply 1 Application topically 2 (two) times daily. Place thin layer of ointment to affected area twice a day for 1 week 02/16/24   Maryruth Sol, FNP  sulfamethoxazole-trimethoprim (BACTRIM DS) 800-160 MG tablet Take 1 tablet by mouth 2 (two) times daily for 7 days. 02/16/24 02/23/24  Maryruth Sol, FNP    Family History Family History  Problem Relation Age of Onset   Arthritis Mother    Diverticulitis Mother    Kidney disease Father    Diabetes Father    Colon cancer Father    Blindness Father    Colon cancer Sister    Diabetes  Sister    Arthritis Maternal Grandmother    Heart disease Maternal Grandfather    Hyperlipidemia Maternal Grandfather    Colon cancer Paternal Grandmother    Crohn's disease Neg Hx    Esophageal cancer Neg Hx    Rectal cancer Neg Hx    Stomach cancer Neg Hx    Ulcerative colitis Neg Hx     Social History Social History   Tobacco Use   Smoking status: Former    Current packs/day: 0.00    Average packs/day: 0.5 packs/day for 20.0 years (10.0 ttl pk-yrs)    Types: Cigarettes    Start date: 08/19/1991    Quit date: 08/19/2011    Years since quitting: 12.5    Passive exposure: Never   Smokeless tobacco: Never  Vaping Use   Vaping status: Never Used  Substance Use Topics   Alcohol use: Yes    Comment:  rare -social   Drug use: No     Allergies   Patient has no known allergies.   Review of Systems Review of Systems  HENT:         Lip abscess      Physical Exam Triage Vital Signs ED Triage Vitals  Encounter Vitals Group     BP 02/19/24 1615 122/89     Systolic BP Percentile --      Diastolic BP Percentile --      Pulse Rate 02/19/24 1615 62     Resp 02/19/24 1615 16     Temp 02/19/24 1615 97.8 F (36.6 C)     Temp src --      SpO2 02/19/24 1615 98 %     Weight --      Height --      Head Circumference --      Peak Flow --      Pain Score 02/19/24 1614 7     Pain Loc --      Pain Education --      Exclude from Growth Chart --    No data found.  Updated Vital Signs BP 122/89   Pulse 62   Temp 97.8 F (36.6 C)   Resp 16   SpO2 98%   Visual Acuity Right Eye Distance:   Left Eye Distance:   Bilateral Distance:    Right Eye Near:   Left Eye Near:    Bilateral Near:     Physical Exam Vitals and nursing note reviewed.  Constitutional:      General: He is not in acute distress.    Appearance: Normal appearance. He is not ill-appearing.  HENT:     Head: Normocephalic and atraumatic.      Comments: Large lip abscess to the right lower lip with some purulent drainage.  See photo Eyes:     Pupils: Pupils are equal, round, and reactive to light.  Cardiovascular:     Rate and Rhythm: Normal rate.  Pulmonary:     Effort: Pulmonary effort is normal.  Skin:    General: Skin is warm and dry.  Neurological:     General: No focal deficit present.     Mental Status: He is alert and oriented to person, place, and time.  Psychiatric:        Mood and Affect: Mood normal.        Behavior: Behavior normal.      UC Treatments / Results  Labs (all labs ordered are listed, but only abnormal results are displayed) Labs Reviewed - No  data to display  EKG   Radiology No results found.  Procedures Procedures (including critical care time)  Medications  Ordered in UC Medications - No data to display  Initial Impression / Assessment and Plan / UC Course  I have reviewed the triage vital signs and the nursing notes.  Pertinent labs & imaging results that were available during my care of the patient were reviewed by me and considered in my medical decision making (see chart for details).     I reviewed exam and symptoms with patient.  Per culture staph infection was sensitive to Bactrim.  He is reporting no improvement after 2-1/2 days and states it is worsening.  Discussed trying to switch to a different antibiotic such as doxycycline versus going to the ER for further evaluation.  He prefers to go to the ER for further treatment.  He will go POV to the emergency room. Final Clinical Impressions(s) / UC Diagnoses   Final diagnoses:  Lip abscess     Discharge Instructions      Please go to the ER for further evaluation of your abscess  ED Prescriptions   None    PDMP not reviewed this encounter.   Alleen Arbour, NP 02/19/24 [phone]

## 2024-02-19 NOTE — ED Notes (Signed)
 Patient is being discharged from the Urgent Care and sent to the Emergency Department via pov . Per Joana Mow NP, patient is in need of higher level of care due to concern for worsening infection involving facial bones/abcess. Patient is aware and verbalizes understanding of plan of care.  Vitals:   02/19/24 1615  BP: 122/89  Pulse: 62  Resp: 16  Temp: 97.8 F (36.6 C)  SpO2: 98%

## 2024-02-25 LAB — AEROBIC/ANAEROBIC CULTURE W GRAM STAIN (SURGICAL/DEEP WOUND): Gram Stain: NONE SEEN

## 2024-02-26 ENCOUNTER — Telehealth (HOSPITAL_BASED_OUTPATIENT_CLINIC_OR_DEPARTMENT_OTHER): Payer: Self-pay | Admitting: *Deleted

## 2024-02-26 NOTE — Telephone Encounter (Signed)
 Post ED Visit - Positive Culture Follow-up  Culture report reviewed by antimicrobial stewardship pharmacist: [facility] [x]  Barbra Boone, [place].D. []  Skeet Duke, Pharm.D., BCPS AQ-ID []  Leslee Rase, Pharm.D., BCPS []  Garland Junk, Pharm.D., BCPS []  [place], [place].D., BCPS, AAHIVP []  Alcide Aly, Pharm.D., BCPS, AAHIVP []  Jerri Morale, PharmD, BCPS []  Graham Laws, PharmD, BCPS []  Cleda Curly, PharmD, BCPS []  Tamar Fairly, PharmD []  Ballard Levels, PharmD, BCPS []  Ollen Beverage, PharmD  [facility] []  Arlyne Bering, PharmD []  Sherryle Don, PharmD []  Van Gelinas, PharmD []  Delila Felty, Rph []  [place]una [place]) Cleora Daft, PharmD []  Augustina Block, PharmD []  Arie Kurtz, PharmD []  Sharlyn Deaner, PharmD []  Agnes Hose, PharmD []  Kendall Pauls, PharmD []  Gladstone Lamer, PharmD []  Armanda Bern, PharmD []  Tera Fellows, PharmD   Positive aerobic/anaerobic culture Treated with clindamycin , organism sensitive to the same and no further patient follow-up is required at this time.  Zeb Heys 02/26/2024, 9:17 AM

## 2024-02-29 ENCOUNTER — Ambulatory Visit (INDEPENDENT_AMBULATORY_CARE_PROVIDER_SITE_OTHER): Payer: 59 | Admitting: Nurse Practitioner

## 2024-02-29 ENCOUNTER — Encounter: Payer: Self-pay | Admitting: Nurse Practitioner

## 2024-02-29 VITALS — BP 124/82 | HR 67 | Temp 97.6°F | Ht 70.0 in | Wt 156.4 lb

## 2024-02-29 DIAGNOSIS — E782 Mixed hyperlipidemia: Secondary | ICD-10-CM | POA: Diagnosis not present

## 2024-02-29 DIAGNOSIS — Z0000 Encounter for general adult medical examination without abnormal findings: Secondary | ICD-10-CM | POA: Diagnosis not present

## 2024-02-29 NOTE — Progress Notes (Signed)
 Provider: Verma Gobble, NP  Patient Care Team: Verma Gobble, NP as PCP - General (Geriatric Medicine) Candyce Champagne, MD as Consulting Physician (General Surgery)  Extended Emergency Contact Information Primary Emergency Contact: Mullings,Heather Address: [address]           [place], [place] [address] [place] Home Phone: [phone] Mobile Phone: [phone] Relation: Spouse Secondary Emergency Contact: Michaels,Jimmy  [place] Home Phone: [phone] Mobile Phone: [phone] Relation: Nephew No Known Allergies Code Status: FULL  Goals of Care: Advanced Directive information    02/29/2024    8:17 AM  Advanced Directives  Does Patient Have a Medical Advance Directive? Yes  Type of Advance Directive Living will  Does patient want to make changes to medical advance directive? No - Patient declined     Chief Complaint  Patient presents with   Annual Exam    Physical    HPI: Patient is a 58 y.o. male seen in today for an wellness exam at [facility] Discussed the use of AI scribe software for clinical note transcription with the patient, who gave verbal consent to proceed.  History of Present Illness Dennis Morris is a 58 year old male who presents for an annual physical exam and health promotion.  He recently developed an infection on his lip following a shaving incident. The issue began on a Sunday, with pain developing by Monday. Initially thought to be a pimple, he attempted to squeeze it. By Tuesday, the swelling worsened, prompting a visit to urgent care where antibiotics were prescribed. Despite this, the condition did not improve significantly, leading to a visit to the emergency room on Friday where he received a steroid shot and a change in antibiotics. He wore a mask at work to prevent alarming patients, which he feels may have irritated the area further. Doing better now, has 1 dose of antibiotics left.   He denies any new  allergies since his last visit. No changes in urinary frequency or flow, and bowel movements are regular. He does not smoke. No chest pains or shortness of breath.   He tries to exercise at home, though it is inconsistent. He maintains an active lifestyle at work and attempts to follow a well-balanced diet, with his wife assisting in this regard. He keeps up with regular eye exams and dental visits.      02/29/2024    8:15 AM 08/19/2017    3:59 PM 06/02/2016    3:43 PM  Depression screen PHQ 2/9  Decreased Interest 0 0 0  Down, Depressed, Hopeless 0 0 0  PHQ - 2 Score 0 0 0       06/02/2016    3:43 PM 08/19/2017    3:59 PM 02/27/2023    2:43 PM 02/29/2024    8:15 AM  Fall Risk  Falls in the past year? No No 0 0  Was there an injury with Fall?   0 0  Fall Risk Category Calculator   0 0  Patient at Risk for Falls Due to   No Fall Risks No Fall Risks  Fall risk Follow up   Falls evaluation completed Falls evaluation completed       No data to display           Health Maintenance  Topic Date Due   Zoster Vaccines- Shingrix (1 of 2) Never done   COVID-19 Vaccine (3 - 2024-25 season) 05/24/2023   INFLUENZA VACCINE  04/22/2024   Colonoscopy  07/12/2027   DTaP/Tdap/Td (2 - Tdap) 08/20/2027   Hepatitis C Screening  Completed   HIV Screening  Completed   HPV VACCINES  Aged Out   Meningococcal B Vaccine  Aged Out    Past Medical History:  Diagnosis Date   Allergy    SEASONAL   Arthritis    right elbow occ. pain   Cataract    hereditary   Headache    occ. none frequent    Past Surgical History:  Procedure Laterality Date   COLONOSCOPY  2014   ELBOW FRACTURE SURGERY Right    surgery to repair and then to debride after infection age 2   HERNIA REPAIR     INGUINAL HERNIA REPAIR Bilateral 08/21/2016   Procedure: LAPAROSCOPIC EXPLORATION AND REPAIR  BILATERAL INGUINAL HERNIA REPAIR;  Surgeon: Candyce Champagne, MD;  Location: [facility];  Service: General;  Laterality: Bilateral;    INSERTION OF MESH Bilateral 08/21/2016   Procedure: INSERTION OF MESH;  Surgeon: Candyce Champagne, MD;  Location: [facility];  Service: General;  Laterality: Bilateral;    Social History   Socioeconomic History   Marital status: Married    Spouse name: Not on file   Number of children: Not on file   Years of education: Not on file   Highest education level: Associate degree: occupational, Scientist, product/process development, or vocational program  Occupational History   Not on file  Tobacco Use   Smoking status: Former    Current packs/day: 0.00    Average packs/day: 0.5 packs/day for 20.0 years (10.0 ttl pk-yrs)    Types: Cigarettes    Start date: 08/19/1991    Quit date: 08/19/2011    Years since quitting: 12.5    Passive exposure: Never   Smokeless tobacco: Never  Vaping Use   Vaping status: Never Used  Substance and Sexual Activity   Alcohol use: Yes    Comment: rare -social   Drug use: No   Sexual activity: Yes  Other Topics Concern   Not on file  Social History Narrative   Not on file   Social Drivers of Health   Financial Resource Strain: Low Risk  (02/23/2023)   Overall Financial Resource Strain (CARDIA)    Difficulty of Paying Living Expenses: Not hard at all  Food Insecurity: No Food Insecurity (02/29/2024)   Hunger Vital Sign    Worried About Running Out of Food in the Last Year: Never true    Ran Out of Food in the Last Year: Never true  Transportation Needs: No Transportation Needs (02/29/2024)   PRAPARE - Administrator, Civil Service (Medical): No    Lack of Transportation (Non-Medical): No  Physical Activity: Insufficiently Active (02/23/2023)   Exercise Vital Sign    Days of Exercise per Week: 5 days    Minutes of Exercise per Session: 10 min  Stress: No Stress Concern Present (02/23/2023)   [employer] of Occupational Health - Occupational Stress Questionnaire    Feeling of Stress : Not at all  Social Connections: Socially Integrated (02/23/2023)   Social Connection  and Isolation Panel [NHANES]    Frequency of Communication with Friends and Family: More than three times a week    Frequency of Social Gatherings with Friends and Family: More than three times a week    Attends Religious Services: More than 4 times per year    Active Member of [employer] or Organizations: Yes    Attends Banker Meetings: More than 4 times per year  Marital Status: Married    Family History  Problem Relation Age of Onset   Arthritis Mother    Diverticulitis Mother    Kidney disease Father    Diabetes Father    Colon cancer Father    Blindness Father    Colon cancer Sister    Diabetes Sister    Arthritis Maternal Grandmother    Heart disease Maternal Grandfather    Hyperlipidemia Maternal Grandfather    Colon cancer Paternal Grandmother    Crohn's disease Neg Hx    Esophageal cancer Neg Hx    Rectal cancer Neg Hx    Stomach cancer Neg Hx    Ulcerative colitis Neg Hx     Review of Systems:  Review of Systems  Constitutional:  Negative for chills, fever and weight loss.  HENT:  Negative for tinnitus.   Respiratory:  Negative for cough, sputum production and shortness of breath.   Cardiovascular:  Negative for chest pain, palpitations and leg swelling.  Gastrointestinal:  Negative for abdominal pain, constipation, diarrhea and heartburn.  Genitourinary:  Negative for dysuria, frequency and urgency.  Musculoskeletal:  Negative for back pain, falls, joint pain and myalgias.  Skin: Negative.   Neurological:  Negative for dizziness and headaches.  Psychiatric/Behavioral:  Negative for depression and memory loss. The patient does not have insomnia.      Allergies as of 02/29/2024   No Known Allergies      Medication List        Accurate as of February 29, 2024  9:11 AM. If you have any questions, ask your nurse or doctor.          acetaminophen  325 MG tablet Commonly known as: TYLENOL  Take 650 mg by mouth every 6 (six) hours as needed.    clindamycin  150 MG capsule Commonly known as: CLEOCIN  Take 1 capsule (150 mg total) by mouth every 6 (six) hours.   EXCEDRIN PO Take by mouth as needed.   fluticasone 50 MCG/ACT nasal spray Commonly known as: FLONASE Place 1 spray into both nostrils See admin instructions. During the Spring & Fall.   ibuprofen 200 MG tablet Commonly known as: ADVIL Take 200 mg by mouth as needed for fever, headache, mild pain or moderate pain.   MULTIVITAMIN GUMMIES ADULT PO Take by mouth.   mupirocin  ointment 2 % Commonly known as: BACTROBAN  Apply 1 Application topically 2 (two) times daily. Place thin layer of ointment to affected area twice a day for 1 week   predniSONE  10 MG tablet Commonly known as: DELTASONE  Take 1 tablet (10 mg total) by mouth daily. Take 4 tablets (40 mg) the first day, take 3 tablets (30 mg) the second day, take 2 tablets (20 mg) the third day and take 1 tabler (10 mg) the final day.          Physical Exam: Vitals:   02/29/24 0817  BP: 124/82  Pulse: 67  Temp: 97.6 F (36.4 C)  SpO2: 98%  Weight: 156 lb 6.4 oz (70.9 kg)  Height: 5\' 10"  (1.778 m)   Body mass index is 22.44 kg/m. Wt Readings from Last 3 Encounters:  02/29/24 156 lb 6.4 oz (70.9 kg)  02/19/24 155 lb (70.3 kg)  02/27/23 157 lb (71.2 kg)    Physical Exam Constitutional:      General: He is not in acute distress.    Appearance: He is well-developed. He is not diaphoretic.  HENT:     Head: Normocephalic and atraumatic.  Right Ear: External ear normal.     Left Ear: External ear normal.     Mouth/Throat:     Pharynx: No oropharyngeal exudate.  Eyes:     Conjunctiva/sclera: Conjunctivae normal.     Pupils: Pupils are equal, round, and reactive to light.  Cardiovascular:     Rate and Rhythm: Normal rate and regular rhythm.     Heart sounds: Normal heart sounds.  Pulmonary:     Effort: Pulmonary effort is normal.     Breath sounds: Normal breath sounds.  Abdominal:      General: Bowel sounds are normal.     Palpations: Abdomen is soft.  Musculoskeletal:        General: No tenderness.     Cervical back: Normal range of motion and neck supple.     Right lower leg: No edema.     Left lower leg: No edema.  Skin:    General: Skin is warm and dry.  Neurological:     Mental Status: He is alert and oriented to person, place, and time.     Labs reviewed: Basic Metabolic Panel: Recent Labs    02/19/24 1731  NA 138  K 4.4  CL 103  CO2 23  GLUCOSE 76  BUN 19  CREATININE 1.03  CALCIUM 9.3   Liver Function Tests: Recent Labs    02/19/24 1731  AST 22  ALT 21  ALKPHOS 67  BILITOT 0.3  PROT 7.5  ALBUMIN 4.5   No results for input(s): "LIPASE", "AMYLASE" in the last 8760 hours. No results for input(s): "AMMONIA" in the last 8760 hours. CBC: Recent Labs    02/19/24 1731  WBC 8.3  NEUTROABS 5.8  HGB 13.8  HCT 41.3  MCV 90.0  PLT 312   Lipid Panel: No results for input(s): "CHOL", "HDL", "LDLCALC", "TRIG", "CHOLHDL", "LDLDIRECT" in the last 8760 hours. No results found for: "HGBA1C"  Procedures: No results found.  Assessment/Plan 1. Routine general medical examination at a health care facility (Primary) -wellness visit completed today, The patient was counseled regarding the appropriate use of alcohol, regular self-examination of the breasts on a monthly basis, prevention of dental and periodontal disease, diet, regular sustained exercise for at least 30 minutes 5 times per week, testicular self-examination on a monthly basis,smoking cessation, tobacco use,  and recommended schedule for GI hemoccult testing, colonoscopy, cholesterol, thyroid and diabetes screening. - EKG 12-Lead-normal sinus   2. Mixed hyperlipidemia Continue dietary modifications with increase in physical activity - Lipid panel - EKG 12-Lead   Next appt: yearly  Tanaiya Kolarik K. Denney Fisherman  [facility] [phone]

## 2024-03-01 ENCOUNTER — Ambulatory Visit: Payer: Self-pay | Admitting: Nurse Practitioner

## 2024-03-01 LAB — LIPID PANEL
Cholesterol: 167 mg/dL (ref <200)
HDL: 65 mg/dL (ref >=40)
LDL Cholesterol (Calc): 77 mg/dL
Non-HDL Cholesterol (Calc): 102 mg/dL (ref <130)
Total CHOL/HDL Ratio: 2.6 (calc) (ref <5.0)
Triglycerides: 155 mg/dL — ABNORMAL HIGH (ref <150)

## 2025-03-03 ENCOUNTER — Encounter: Payer: Self-pay | Admitting: Nurse Practitioner
